# Patient Record
Sex: Male | Born: 1965 | Race: White | Hispanic: No | Marital: Married | Smoking: Never smoker
Health system: Southern US, Community
[De-identification: ages and names within clinical notes are randomized; demographics above are authoritative.]

## PROBLEM LIST (undated history)

## (undated) DIAGNOSIS — N200 Calculus of kidney: Secondary | ICD-10-CM

---

## 2014-07-31 ENCOUNTER — Other Ambulatory Visit (HOSPITAL_COMMUNITY): Payer: Self-pay | Admitting: Sports Medicine

## 2014-07-31 ENCOUNTER — Encounter (HOSPITAL_COMMUNITY): Payer: Self-pay

## 2014-07-31 ENCOUNTER — Ambulatory Visit (HOSPITAL_COMMUNITY)
Admission: RE | Admit: 2014-07-31 | Discharge: 2014-07-31 | Disposition: A | Discharge status: Home / Self Care | Payer: BLUE CROSS/BLUE SHIELD | Source: Ambulatory Visit | Attending: Sports Medicine | Admitting: Sports Medicine

## 2014-07-31 DIAGNOSIS — M545 Low back pain, unspecified: Secondary | ICD-10-CM

## 2014-07-31 DIAGNOSIS — R1031 Right lower quadrant pain: Secondary | ICD-10-CM | POA: Diagnosis not present

## 2014-08-16 ENCOUNTER — Emergency Department (HOSPITAL_COMMUNITY): Payer: BLUE CROSS/BLUE SHIELD

## 2014-08-16 ENCOUNTER — Encounter (HOSPITAL_COMMUNITY): Payer: Self-pay | Admitting: Emergency Medicine

## 2014-08-16 ENCOUNTER — Emergency Department (HOSPITAL_COMMUNITY)
Admission: EM | Admit: 2014-08-16 | Discharge: 2014-08-16 | Disposition: A | Discharge status: Home / Self Care | Payer: BLUE CROSS/BLUE SHIELD | Attending: Emergency Medicine | Admitting: Emergency Medicine

## 2014-08-16 DIAGNOSIS — J069 Acute upper respiratory infection, unspecified: Secondary | ICD-10-CM | POA: Diagnosis not present

## 2014-08-16 DIAGNOSIS — J029 Acute pharyngitis, unspecified: Secondary | ICD-10-CM | POA: Diagnosis present

## 2014-08-16 DIAGNOSIS — Z79899 Other long term (current) drug therapy: Secondary | ICD-10-CM | POA: Diagnosis not present

## 2014-08-16 DIAGNOSIS — B9789 Other viral agents as the cause of diseases classified elsewhere: Secondary | ICD-10-CM

## 2014-08-16 DIAGNOSIS — Z87442 Personal history of urinary calculi: Secondary | ICD-10-CM | POA: Insufficient documentation

## 2014-08-16 HISTORY — DX: Calculus of kidney: N20.0

## 2014-08-16 LAB — RAPID STREP SCREEN (MED CTR MEBANE ONLY): Streptococcus, Group A Screen (Direct): NEGATIVE

## 2014-08-16 MED ORDER — DEXAMETHASONE SODIUM PHOSPHATE 4 MG/ML IJ SOLN
4.0000 mg | Freq: Once | INTRAMUSCULAR | Status: AC
Start: 2014-08-16 — End: 2014-08-16
  Administered 2014-08-16: 4 mg via INTRAMUSCULAR
  Filled 2014-08-16: qty 1

## 2014-08-16 MED ORDER — LIDOCAINE VISCOUS 2 % MT SOLN
15.0000 mL | Freq: Once | OROMUCOSAL | Status: AC
  Administered 2014-08-16: 15 mL via OROMUCOSAL
  Filled 2014-08-16: qty 15

## 2014-08-16 MED ORDER — LIDOCAINE VISCOUS 2 % MT SOLN: 20.0000 mL | OROMUCOSAL | Status: DC | PRN

## 2014-08-16 NOTE — ED Provider Notes (Signed)
CSN: [id number]     Arrival date & time 08/16/14  1009 History   First MD Initiated Contact with Patient 08/16/14 1013     Chief Complaint  Patient presents with  . Sore Throat  . URI   HPI  Patient is a 49 year old male with past medical history of kidney stones who presents emergency room for evaluation of sore throat and cough. Patient states that for the last 4 days he's been having coughing, congestion, mildly runny nose, production of clear sputum. Patient states that he woke up this morning with a very sore throat. He felt like his throat was really swollen and very uncomfortable to swallow. He took ibuprofen and DayQuil. He currently feels that his throat is only sore if he has to swallow. He has been able to tolerate eating and drinking this morning. He currently denies chest pain, shortness of breath, difficulty with tolerating secretions, fevers, nausea, vomiting. Patient does not have a PCP here. He does state that his grandchildren who has been exposed to in the past week has had similar upper respiratory infection like symptoms. Patient states he has no known allergies at this time.  Past Medical History  Diagnosis Date  . Kidney calculi    History reviewed. No pertinent past surgical history. No family history on file. History  Substance Use Topics  . Smoking status: Never Smoker   . Smokeless tobacco: Not on file  . Alcohol Use: Yes    Review of Systems  Constitutional: Negative for fever, chills and fatigue.  HENT: Positive for congestion and sore throat. Negative for ear discharge, ear pain, rhinorrhea and sinus pressure.   Respiratory: Positive for cough. Negative for chest tightness and shortness of breath.   Cardiovascular: Negative for chest pain and palpitations.  Gastrointestinal: Negative for nausea and vomiting.  All other systems reviewed and are negative.     Allergies  Review of patient's allergies indicates no known allergies.  Home Medications    Prior to Admission medications   Medication Sig Start Date End Date Taking? Authorizing Provider  chlorthalidone (HYGROTON) 25 MG tablet Take 25 mg by mouth daily.   Yes Historical Provider, MD  omeprazole (PRILOSEC) 20 MG capsule Take 20 mg by mouth daily.   Yes Historical Provider, MD  Pseudoephedrine-APAP-DM (DAYQUIL MULTI-SYMPTOM COLD/FLU PO) Take 30 mLs by mouth 2 (two) times daily as needed (cough and sore throat).   Yes Historical Provider, MD  lidocaine (XYLOCAINE) 2 % solution Use as directed 20 mLs in the mouth or throat as needed for mouth pain. 08/16/14   Klair Leising A Forcucci, PA-C   BP 131/64 mmHg  Pulse 80  Temp(Src) 98.5 F (36.9 C) (Oral)  Resp 16  SpO2 93% Physical Exam  Constitutional: He is oriented to person, place, and time. He appears well-developed and well-nourished. No distress.  HENT:  Head: Normocephalic and atraumatic.  Right Ear: Tympanic membrane normal.  Left Ear: Tympanic membrane normal.  Nose: Mucosal edema present. Right sinus exhibits no maxillary sinus tenderness and no frontal sinus tenderness. Left sinus exhibits no maxillary sinus tenderness and no frontal sinus tenderness.  Mouth/Throat: Uvula is midline and mucous membranes are normal. No trismus in the jaw. Posterior oropharyngeal edema and posterior oropharyngeal erythema present. No oropharyngeal exudate.  Eyes: Conjunctivae and EOM are normal. Pupils are equal, round, and reactive to light. No scleral icterus.  Neck: Normal range of motion. Neck supple. No JVD present. No thyromegaly present.  Cardiovascular: Normal rate, regular rhythm, normal heart sounds  and intact distal pulses.  Exam reveals no gallop and no friction rub.   No murmur heard. Pulmonary/Chest: Effort normal and breath sounds normal. No respiratory distress. He has no wheezes. He has no rales. He exhibits no tenderness.  Musculoskeletal: Normal range of motion.  Lymphadenopathy:    He has no cervical adenopathy.   Neurological: He is alert and oriented to person, place, and time.  Skin: Skin is warm and dry. He is not diaphoretic.  Psychiatric: He has a normal mood and affect. His behavior is normal. Judgment and thought content normal.  Nursing note and vitals reviewed.   ED Course  Procedures (including critical care time) Labs Review Labs Reviewed  RAPID STREP SCREEN  CULTURE, GROUP A STREP    Imaging Review Dg Chest 2 View  08/16/2014   CLINICAL DATA:  Cough, sore throat, throat swelling for 4 days  EXAM: CHEST  2 VIEW  COMPARISON:  None.  FINDINGS: Cardiomediastinal silhouette is unremarkable. No acute infiltrate or pleural effusion. No pulmonary edema. Bony thorax is unremarkable.  IMPRESSION: No active cardiopulmonary disease.   Electronically Signed   By: Natasha Mead M.D.   On: 08/16/2014 10:53     EKG Interpretation None      MDM   Final diagnoses:  Viral URI with cough   Patient is a 49 year old male who presents emergency room for evaluation of sore throat and upper respiratory infection symptoms. Patient states this been going on for 4 days. Physical exam reveals some edema and erythema of the oropharynx with midline uvular rise. There is no evidence for trismus, airway compromise, or peritonsillar abscess at this time. Patient is able to tolerate by mouth fluids. Rapid strep throat is negative. Chest x-ray is negative. Suspect that this is likely a viral upper respiratory infection with cough. Patient given viscous lidocaine with great relief of symptoms. We'll give an IM shot of 4 mg of Decadron for symptomatic relief. I have recommended that he continue take ibuprofen as needed for sore throat. Patient to return for difficulty swallowing, trismus, drooling, worsening shortness of breath, or intractable fevers. He states understanding and agreement at this time. Patient given resource list to set up care with a primary care doctor in this area. Patient discussed with Dr. Silverio Lay who  agrees the above workup and plan. Patient stable for discharge at this time.  Eben Burow, PA-C 08/16/14 1135  Richardean Canal, MD 08/16/14 [phone]

## 2014-08-16 NOTE — ED Notes (Signed)
Pt verbalized understanding of discharge instructions and has no further questions.  

## 2014-08-16 NOTE — Discharge Instructions (Signed)
Upper Respiratory Infection, Adult °An upper respiratory infection (URI) is also sometimes known as the common cold. The upper respiratory tract includes the nose, sinuses, throat, trachea, and bronchi. Bronchi are the airways leading to the lungs. Most people improve within 1 week, but symptoms can last up to 2 weeks. A residual cough may last even longer.  °CAUSES °Many different viruses can infect the tissues lining the upper respiratory tract. The tissues become irritated and inflamed and often become very moist. Mucus production is also common. A cold is contagious. You can easily spread the virus to others by oral contact. This includes kissing, sharing a glass, coughing, or sneezing. Touching your mouth or nose and then touching a surface, which is then touched by another person, can also spread the virus. °SYMPTOMS  °Symptoms typically develop 1 to 3 days after you come in contact with a cold virus. Symptoms vary from person to person. They may include: °· Runny nose. °· Sneezing. °· Nasal congestion. °· Sinus irritation. °· Sore throat. °· Loss of voice (laryngitis). °· Cough. °· Fatigue. °· Muscle aches. °· Loss of appetite. °· Headache. °· Low-grade fever. °DIAGNOSIS  °You might diagnose your own cold based on familiar symptoms, since most people get a cold 2 to 3 times a year. Your caregiver can confirm this based on your exam. Most importantly, your caregiver can check that your symptoms are not due to another disease such as strep throat, sinusitis, pneumonia, asthma, or epiglottitis. Blood tests, throat tests, and X-rays are not necessary to diagnose a common cold, but they may sometimes be helpful in excluding other more serious diseases. Your caregiver will decide if any further tests are required. °RISKS AND COMPLICATIONS  °You may be at risk for a more severe case of the common cold if you smoke cigarettes, have chronic heart disease (such as heart failure) or lung disease (such as asthma), or if  you have a weakened immune system. The very young and very old are also at risk for more serious infections. Bacterial sinusitis, middle ear infections, and bacterial pneumonia can complicate the common cold. The common cold can worsen asthma and chronic obstructive pulmonary disease (COPD). Sometimes, these complications can require emergency medical care and may be life-threatening. °PREVENTION  °The best way to protect against getting a cold is to practice good hygiene. Avoid oral or hand contact with people with cold symptoms. Wash your hands often if contact occurs. There is no clear evidence that vitamin C, vitamin E, echinacea, or exercise reduces the chance of developing a cold. However, it is always recommended to get plenty of rest and practice good nutrition. °TREATMENT  °Treatment is directed at relieving symptoms. There is no cure. Antibiotics are not effective, because the infection is caused by a virus, not by bacteria. Treatment may include: °· Increased fluid intake. Sports drinks offer valuable electrolytes, sugars, and fluids. °· Breathing heated mist or steam (vaporizer or shower). °· Eating chicken soup or other clear broths, and maintaining good nutrition. °· Getting plenty of rest. °· Using gargles or lozenges for comfort. °· Controlling fevers with ibuprofen or acetaminophen as directed by your caregiver. °· Increasing usage of your inhaler if you have asthma. °Zinc gel and zinc lozenges, taken in the first 24 hours of the common cold, can shorten the duration and lessen the severity of symptoms. Pain medicines may help with fever, muscle aches, and throat pain. A variety of non-prescription medicines are available to treat congestion and runny nose. Your caregiver   can make recommendations and may suggest nasal or lung inhalers for other symptoms.  HOME CARE INSTRUCTIONS   Only take over-the-counter or prescription medicines for pain, discomfort, or fever as directed by your  caregiver.  Use a warm mist humidifier or inhale steam from a shower to increase air moisture. This may keep secretions moist and make it easier to breathe.  Drink enough water and fluids to keep your urine clear or pale yellow.  Rest as needed.  Return to work when your temperature has returned to normal or as your caregiver advises. You may need to stay home longer to avoid infecting others. You can also use a face mask and careful hand washing to prevent spread of the virus. SEEK MEDICAL CARE IF:   After the first few days, you feel you are getting worse rather than better.  You need your caregiver's advice about medicines to control symptoms.  You develop chills, worsening shortness of breath, or brown or red sputum. These may be signs of pneumonia.  You develop yellow or brown nasal discharge or pain in the face, especially when you bend forward. These may be signs of sinusitis.  You develop a fever, swollen neck glands, pain with swallowing, or white areas in the back of your throat. These may be signs of strep throat. SEEK IMMEDIATE MEDICAL CARE IF:   You have a fever.  You develop severe or persistent headache, ear pain, sinus pain, or chest pain.  You develop wheezing, a prolonged cough, cough up blood, or have a change in your usual mucus (if you have chronic lung disease).  You develop sore muscles or a stiff neck. Document Released: 01/04/2001 Document Revised: 10/03/2011 Document Reviewed: 10/16/2013 [facility] Patient Information 2015 [place]. This information is not intended to replace advice given to you by your health care provider. Make sure you discuss any questions you have with your health care provider.  Pharyngitis Pharyngitis is redness, pain, and swelling (inflammation) of your pharynx.  CAUSES  Pharyngitis is usually caused by infection. Most of the time, these infections are from viruses (viral) and are part of a cold. However, sometimes pharyngitis  is caused by bacteria (bacterial). Pharyngitis can also be caused by allergies. Viral pharyngitis may be spread from person to person by coughing, sneezing, and personal items or utensils (cups, forks, spoons, toothbrushes). Bacterial pharyngitis may be spread from person to person by more intimate contact, such as kissing.  SIGNS AND SYMPTOMS  Symptoms of pharyngitis include:   Sore throat.   Tiredness (fatigue).   Low-grade fever.   Headache.  Joint pain and muscle aches.  Skin rashes.  Swollen lymph nodes.  Plaque-like film on throat or tonsils (often seen with bacterial pharyngitis). DIAGNOSIS  Your health care provider will ask you questions about your illness and your symptoms. Your medical history, along with a physical exam, is often all that is needed to diagnose pharyngitis. Sometimes, a rapid strep test is done. Other lab tests may also be done, depending on the suspected cause.  TREATMENT  Viral pharyngitis will usually get better in 3-4 days without the use of medicine. Bacterial pharyngitis is treated with medicines that kill germs (antibiotics).  HOME CARE INSTRUCTIONS   Drink enough water and fluids to keep your urine clear or pale yellow.   Only take over-the-counter or prescription medicines as directed by your health care provider:   If you are prescribed antibiotics, make sure you finish them even if you start to feel better.  Do not take aspirin.   Get lots of rest.   Gargle with 8 oz of salt water ( tsp of salt per 1 qt of water) as often as every 1-2 hours to soothe your throat.   Throat lozenges (if you are not at risk for choking) or sprays may be used to soothe your throat. SEEK MEDICAL CARE IF:   You have large, tender lumps in your neck.  You have a rash.  You cough up green, yellow-brown, or bloody spit. SEEK IMMEDIATE MEDICAL CARE IF:   Your neck becomes stiff.  You drool or are unable to swallow liquids.  You vomit or are  unable to keep medicines or liquids down.  You have severe pain that does not go away with the use of recommended medicines.  You have trouble breathing (not caused by a stuffy nose). MAKE SURE YOU:   Understand these instructions.  Will watch your condition.  Will get help right away if you are not doing well or get worse. Document Released: 07/11/2005 Document Revised: 05/01/2013 Document Reviewed: 03/18/2013 [facility] Patient Information 2015 [place], [place]. This information is not intended to replace advice given to you by your health care provider. Make sure you discuss any questions you have with your health care provider.   Emergency Department Resource Guide 1) Find a Doctor and Pay Out of Pocket Although you won't have to find out who is covered by your insurance plan, it is a good idea to ask around and get recommendations. You will then need to call the office and see if the doctor you have chosen will accept you as a new patient and what types of options they offer for patients who are self-pay. Some doctors offer discounts or will set up payment plans for their patients who do not have insurance, but you will need to ask so you aren't surprised when you get to your appointment.  2) Contact Your Local Health Department Not all health departments have doctors that can see patients for sick visits, but many do, so it is worth a call to see if yours does. If you don't know where your local health department is, you can check in your phone book. The CDC also has a tool to help you locate your state's health department, and many state websites also have listings of all of their local health departments.  3) Find a Walk-in Clinic If your illness is not likely to be very severe or complicated, you may want to try a walk in clinic. These are popping up all over the country in pharmacies, drugstores, and shopping centers. They're usually staffed by nurse practitioners or physician  assistants that have been trained to treat common illnesses and complaints. They're usually fairly quick and inexpensive. However, if you have serious medical issues or chronic medical problems, these are probably not your best option.  No Primary Care Doctor: - Call Health Connect at  [phone] - they can help you locate a primary care doctor that  accepts your insurance, provides certain services, etc. - Physician Referral Service- [phone]  Chronic Pain Problems: Organization         Address  Phone   Notes  [facility]  [phone] Patients need to be referred by their primary care doctor.   Medication Assistance: Organization         Address  Phone   Notes  [facility] Medication [facility] [address] [place], [place] [address] [phone] --Must be a resident of [facility] [place] -- Must have NO insurance coverage whatsoever (no Medicaid/ Medicare, etc.) -- The pt. MUST have a primary care doctor that directs their care regularly and follows them in the community   [facility]  [phone]   [employer]  [phone]    Agencies that provide inexpensive medical care: Organization         Address  Phone   Notes  [facility]  [phone]   [facility]    [phone]   [facility] [address] [place], [place] [address] [phone]   [facility] [address], [place] [phone]   Planned Parenthood    [phone]   [facility]    [phone]   [facility]  [address], [place] Phone:  [phone], Fax:  [phone] Hours of Operation:  9 am - 6 pm, M-F.  Also accepts Medicaid/Medicare and self-pay.  [facility]  [address], [place] Phone: [phone], Fax: [phone]. Hours of Operation:  8:30 am - 5:30 pm, M-F.  Also accepts  Medicaid and self-pay.  [facility] [place] [address] [place] Phone: [phone]   [facility] [address] Natasha Bence [place], [place] [phone], Ext. [phone] Mondays & Thursdays: 7-9 AM.  First 15 patients are seen on a first come, first serve basis.    Medicaid-accepting [facility] Providers:  Organization         Address  Phone   Notes  [facility] [address], [place] [phone] Also accepts self-pay patients.  [facility] [address] Laurell Josephs [place], [place]  [phone]   [facility] [address], [place] [phone]   [facility] Family Medicine [address], [place] [phone]   Renaye Rakers [address], [place]   [phone] Only accepts [place] Access [place] patients after they have their name applied to their card.   Self-Pay (no insurance) in [facility]:  Organization         Address  Phone   Notes  Sickle Cell Patients, [facility] Internal Medicine [address] [place], [place] [phone]   [facility] [address] [place], [place] [phone]   [facility]  [address], [place] [phone]   [facility]/Dr. Osei-Bonsu  [address], [place] or [address], [place] [phone] Phone number for both [place] and [place] locations is the same.  [facility] [address], [place] [phone]   [facility] [address], [place] or [address] [phone]   [facility] [address] [place] [phone], phone; [phone], fax Sees patients 1st and 3rd Saturday of every month.  Must not qualify for public or private insurance (i.e. Medicaid, Medicare, [place] Health Choice, Veterans' Benefits)  Household  income should be no more than 200% of the poverty level The clinic cannot treat you if you are pregnant or think you are pregnant  Sexually transmitted diseases are not treated at the clinic.    Dental Care: Organization  Address  Phone  Notes  [facility] Department of [facility] [address] [place], [place] [phone] Accepts children up to age 70 who are enrolled in [place] or [place] Health Choice; pregnant women with a Medicaid card; and children who have applied for Medicaid or [place] Health Choice, but were declined, whose parents can pay a reduced fee at time of service.  [facility] Department of [facility]  [address], [place] [phone] Accepts children up to age 103 who are enrolled in [place] or [place] Health Choice; pregnant women with a Medicaid card; and children who have applied for Medicaid or [place] Health Choice, but were declined, whose parents can pay a reduced fee at time of service.  [facility]  [address] [place], [place] [phone] Patients are seen by appointment only. Walk-ins are not accepted. [facility] will see patients 28 years of age and older. Monday - Tuesday (8am-5pm) Most Wednesdays (8:30-5pm) $30 per visit, cash only  [facility]  [address], [facility] [phone] Patients are seen by appointment only. Walk-ins are not accepted. [facility] will see patients 48 years of age and older. One Wednesday Evening (Monthly: Volunteer Based).  $30 per visit, cash only  [employer]  [phone] for adults; Children under age 19, call Graduate Pediatric Dentistry at [phone]. Children aged 20-14, please call [phone] to request a pediatric application.  Dental services are provided in all areas of dental care including fillings, crowns and bridges, complete and partial dentures, implants, gum  treatment, root canals, and extractions. Preventive care is also provided. Treatment is provided to both adults and children. Patients are selected via a lottery and there is often a waiting list.   [facility] [address], [place]  [phone] [email or web address]   [facility] [address] [place], [place] [phone], Ext. [phone] Second and Fourth Thursday of each month, opens at 6:30 AM; Clinic ends at 9 AM.  Patients are seen on a first-come first-served basis, and a limited number are seen during each clinic.   [facility]  [address] Ether Griffins [place], [place] [phone]   Eligibility Requirements You must have lived in [place], [place], or [place] counties for at least the last three months.   You cannot be eligible for state or federal sponsored [place], including [employer], [place], or [employer].   You generally cannot be eligible for healthcare insurance through your employer.    How to apply: Eligibility screenings are held every Tuesday and Wednesday afternoon from 1:00 pm until 4:00 pm. You do not need an appointment for the interview!  [facility] [address] [place], [place] [phone]   [facility] Health Department  [phone]   [facility] Health Department  [phone]   [facility] Health Department  [phone]    Behavioral Health Resources in the Community: Intensive Outpatient Programs Organization         Address  Phone  Notes  [facility] [address] [place], [place] [phone]   [facility] Outpatient [address], [place], [place] [phone]   [facility] [address] [place], [place]  [phone]   [facility] Mental Health [address]  [place], [place] [phone] or [phone]   Substance Abuse Resources Organization  Address  Phone  Notes  Alcohol and  Drug Services  [phone]   [facility]  [phone]   [facility]  [phone]   Floydene Flock  [phone]   Residential & Outpatient Substance Abuse Program  [phone]   Psychological Services Organization         Address  Phone  Notes  [facility]  [phone]   [facility]  [phone]   [facility] Mental Health [address], [place] [phone] or [phone]    Mobile Crisis Teams Organization         Address  Phone  Notes  [facility], Mobile Crisis Care Unit  [phone]   [facility]  [address] [place], [place] [phone]   Doristine Locks [address] [place] [phone]    Self-Help/Support Groups Organization         Address  Phone             Notes  [facility] [place] - variety of support groups  [phone]- [phone] Call for more information  Narcotics Anonymous (NA), [facility] [address], [employer] [place]  2 meetings at this location   Statistician         Address  Phone  Notes  [facility] [address],    [place]  [phone]   [facility]  [address], [place] [address], [place], [place] [phone]   [facility] Treatment Facility [address] [place], [place] [phone] Admissions: 8am-3pm M-F  [facility] [address]    [place], [place] [phone]   [facility] [address] [place], [place], [place] [phone]   [facility] [address]  [place], [place] [phone]   [facility] - Intensive Outpatient [address] Laurell Josephs [address], [place], [place] [phone]   [facility] ([facility] [address] [place]  [place], [place] [phone] or [phone]   [facility] ([facility]) [address], [place], [place] [phone] Accepts Medicaid  [facility]  [place] [address]  [place] [phone] Substance Abuse/Addiction Treatment   [facility] Organization         Address  Phone  Notes  [facility]  [phone]   Angie Fava, PhD [address] Ervin Knack [place], [place]   [phone] or [phone]   [facility] Behavioral   [address] [place], [place] [phone]   [facility] [address], [place], [place] [phone] Insurance/Medicaid/sponsorship through [facility] and Families [address]                                    [place], [place] [phone] Therapy/tele-psych/case  [facility] [address], [place] [phone]    Dr. Lolly Mustache  [phone]   [facility]  [employer] [facility] Dept. 1) [address] [place] 2) [address] [place] 3)  [address], [place] [phone]  [phone]   [facility] Child Abuse Hotline 984-469-7212 or [phone] (After Hours)

## 2014-08-16 NOTE — ED Notes (Signed)
Pt. Stated, I've had a cold and I feel like my throat has glass in there when I swallow

## 2014-08-18 LAB — CULTURE, GROUP A STREP

## 2019-02-10 ENCOUNTER — Emergency Department (HOSPITAL_COMMUNITY): Payer: 59

## 2019-02-10 ENCOUNTER — Encounter (HOSPITAL_COMMUNITY): Payer: Self-pay | Admitting: Emergency Medicine

## 2019-02-10 ENCOUNTER — Other Ambulatory Visit: Payer: Self-pay

## 2019-02-10 ENCOUNTER — Emergency Department (HOSPITAL_COMMUNITY)
Admission: EM | Admit: 2019-02-10 | Discharge: 2019-02-10 | Disposition: A | Discharge status: Home / Self Care | Payer: 59 | Attending: Emergency Medicine | Admitting: Emergency Medicine

## 2019-02-10 DIAGNOSIS — Z6835 Body mass index (BMI) 35.0-35.9, adult: Secondary | ICD-10-CM | POA: Diagnosis not present

## 2019-02-10 DIAGNOSIS — N202 Calculus of kidney with calculus of ureter: Secondary | ICD-10-CM | POA: Insufficient documentation

## 2019-02-10 DIAGNOSIS — Z79899 Other long term (current) drug therapy: Secondary | ICD-10-CM | POA: Insufficient documentation

## 2019-02-10 DIAGNOSIS — N23 Unspecified renal colic: Secondary | ICD-10-CM

## 2019-02-10 DIAGNOSIS — Z87442 Personal history of urinary calculi: Secondary | ICD-10-CM | POA: Insufficient documentation

## 2019-02-10 DIAGNOSIS — E669 Obesity, unspecified: Secondary | ICD-10-CM | POA: Insufficient documentation

## 2019-02-10 DIAGNOSIS — N130 Hydronephrosis with ureteropelvic junction obstruction: Secondary | ICD-10-CM | POA: Insufficient documentation

## 2019-02-10 DIAGNOSIS — R1032 Left lower quadrant pain: Secondary | ICD-10-CM | POA: Diagnosis present

## 2019-02-10 DIAGNOSIS — N132 Hydronephrosis with renal and ureteral calculous obstruction: Secondary | ICD-10-CM

## 2019-02-10 HISTORY — DX: Calculus of kidney: N20.0

## 2019-02-10 LAB — CBC
HCT: 48.8 % (ref 39.0–52.0)
Hemoglobin: 16.6 g/dL (ref 13.0–17.0)
MCH: 31 pg (ref 26.0–34.0)
MCHC: 34 g/dL (ref 30.0–36.0)
MCV: 91 fL (ref 80.0–100.0)
Platelets: 235 10*3/uL (ref 150–400)
RBC: 5.36 MIL/uL (ref 4.22–5.81)
RDW: 12.7 % (ref 11.5–15.5)
WBC: 8.8 10*3/uL (ref 4.0–10.5)
nRBC: 0 % (ref 0.0–0.2)

## 2019-02-10 LAB — URINALYSIS, ROUTINE W REFLEX MICROSCOPIC
Glucose, UA: NEGATIVE mg/dL
Ketones, ur: NEGATIVE mg/dL
Nitrite: NEGATIVE
Protein, ur: 100 mg/dL — AB
Specific Gravity, Urine: 1.025 (ref 1.005–1.030)
pH: 6 (ref 5.0–8.0)

## 2019-02-10 LAB — BASIC METABOLIC PANEL
Anion gap: 10 (ref 5–15)
BUN: 15 mg/dL (ref 6–20)
CO2: 25 mmol/L (ref 22–32)
Calcium: 9.1 mg/dL (ref 8.9–10.3)
Chloride: 104 mmol/L (ref 98–111)
Creatinine, Ser: 1.07 mg/dL (ref 0.61–1.24)
GFR calc Af Amer: >60 mL/min (ref >60)
GFR calc non Af Amer: >60 mL/min (ref >60)
Glucose, Bld: 76 mg/dL (ref 70–99)
Potassium: 4.3 mmol/L (ref 3.5–5.1)
Sodium: 139 mmol/L (ref 135–145)

## 2019-02-10 LAB — URINALYSIS, MICROSCOPIC (REFLEX): RBC / HPF: >50 RBC/hpf (ref 0–5)

## 2019-02-10 MED ORDER — HYDROMORPHONE HCL 1 MG/ML IJ SOLN
1.0000 mg | Freq: Once | INTRAMUSCULAR | Status: AC
  Administered 2019-02-10: 19:00:00 1 mg via INTRAVENOUS
  Filled 2019-02-10: qty 1

## 2019-02-10 MED ORDER — HYDROMORPHONE HCL 1 MG/ML IJ SOLN
1.0000 mg | Freq: Once | INTRAMUSCULAR | Status: AC
  Administered 2019-02-10: 18:00:00 1 mg via INTRAVENOUS
  Filled 2019-02-10: qty 1

## 2019-02-10 MED ORDER — HYDROMORPHONE HCL 1 MG/ML IJ SOLN
1.0000 mg | Freq: Once | INTRAMUSCULAR | Status: AC
  Administered 2019-02-10: 1 mg via INTRAVENOUS
  Filled 2019-02-10: qty 1

## 2019-02-10 MED ORDER — OXYCODONE-ACETAMINOPHEN 5-325 MG PO TABS: 1.0000 | ORAL_TABLET | ORAL | 0 refills | Status: DC | PRN

## 2019-02-10 MED ORDER — ONDANSETRON HCL 4 MG/2ML IJ SOLN
4.0000 mg | Freq: Once | INTRAMUSCULAR | Status: AC
  Administered 2019-02-10: 16:00:00 4 mg via INTRAVENOUS
  Filled 2019-02-10: qty 2

## 2019-02-10 MED ORDER — IBUPROFEN 600 MG PO TABS: 600.0000 mg | ORAL_TABLET | Freq: Three times a day (TID) | ORAL | 0 refills | Status: DC | PRN

## 2019-02-10 MED ORDER — TAMSULOSIN HCL 0.4 MG PO CAPS: 0.4000 mg | ORAL_CAPSULE | Freq: Every day | ORAL | 0 refills | Status: DC

## 2019-02-10 MED ORDER — KETOROLAC TROMETHAMINE 30 MG/ML IJ SOLN
15.0000 mg | Freq: Once | INTRAMUSCULAR | Status: AC
  Administered 2019-02-10: 17:00:00 15 mg via INTRAVENOUS
  Filled 2019-02-10: qty 1

## 2019-02-10 MED ORDER — SODIUM CHLORIDE 0.9 % IV SOLN
1.5000 mg/kg | Freq: Once | INTRAVENOUS | Status: AC
  Administered 2019-02-10: 20:00:00 176 mg via INTRAVENOUS
  Filled 2019-02-10: qty 8.8

## 2019-02-10 MED ORDER — ONDANSETRON 8 MG PO TBDP: 8.0000 mg | ORAL_TABLET | Freq: Three times a day (TID) | ORAL | 0 refills | Status: DC | PRN

## 2019-02-10 MED ORDER — OXYCODONE-ACETAMINOPHEN 5-325 MG PO TABS
2.0000 | ORAL_TABLET | Freq: Once | ORAL | Status: AC
  Administered 2019-02-10: 22:00:00 2 via ORAL
  Filled 2019-02-10: qty 2

## 2019-02-10 NOTE — ED Provider Notes (Signed)
[facility] EMERGENCY DEPARTMENT Provider Note   CSN: [id number] Arrival date & time: 02/10/19  1357    History   Chief Complaint Chief Complaint  Patient presents with   Flank Pain    HPI Bradley Frazier is a 53 y.o. male.     HPI   He presents for evaluation of right lower quadrant abdominal pain that started 3 hours ago, and feels like a kidney stone problem to him.  Last difficulty with kidney stones was about a year ago.  He denies dysuria, urinary frequency, hematuria, fever, chills, nausea or vomiting.  There are no other known modifying factors.  Past Medical History:  Diagnosis Date   Kidney calculi    Kidney stone     There are no active problems to display for this patient.   History reviewed. No pertinent surgical history.      Home Medications    Prior to Admission medications   Medication Sig Start Date End Date Taking? Authorizing Provider  chlorthalidone (HYGROTON) 25 MG tablet Take 25 mg by mouth daily.    [provider]  lidocaine (XYLOCAINE) 2 % solution Use as directed 20 mLs in the mouth or throat as needed for mouth pain. 08/16/14   Rolene Course, PA-C  omeprazole (PRILOSEC) 20 MG capsule Take 20 mg by mouth daily.    [provider]  Pseudoephedrine-APAP-DM (DAYQUIL MULTI-SYMPTOM COLD/FLU PO) Take 30 mLs by mouth 2 (two) times daily as needed (cough and sore throat).    [provider]    Family History No family history on file.  Social History Social History   Tobacco Use   Smoking status: Never Smoker   Smokeless tobacco: Never Used  Substance Use Topics   Alcohol use: Yes   Drug use: No     Allergies   Patient has no known allergies.   Review of Systems Review of Systems  All other systems reviewed and are negative.    Physical Exam Updated Vital Signs BP 126/79    Pulse 63    Temp 97.9 F (36.6 C) (Oral)    Resp 11    Ht 6' (1.829 m)    Wt 117.9 kg    SpO2 93%    BMI  35.26 kg/m   Physical Exam Vitals signs and nursing note reviewed.  Constitutional:      General: He is in acute distress (Uncomfortable).     Appearance: He is well-developed. He is obese. He is not ill-appearing, toxic-appearing or diaphoretic.  HENT:     Head: Normocephalic and atraumatic.     Right Ear: External ear normal.     Left Ear: External ear normal.     Mouth/Throat:     Mouth: Mucous membranes are moist.     Pharynx: No oropharyngeal exudate or posterior oropharyngeal erythema.  Eyes:     Conjunctiva/sclera: Conjunctivae normal.     Pupils: Pupils are equal, round, and reactive to light.  Neck:     Musculoskeletal: Normal range of motion and neck supple.     Trachea: Phonation normal.  Cardiovascular:     Rate and Rhythm: Normal rate.  Pulmonary:     Effort: Pulmonary effort is normal.  Abdominal:     General: There is no distension.     Palpations: Abdomen is soft.     Tenderness: There is no abdominal tenderness.     Hernia: No hernia is present.  Genitourinary:    Comments: No right flank tenderness. Musculoskeletal:  Normal range of motion.  Skin:    General: Skin is warm and dry.  Neurological:     Mental Status: He is alert and oriented to person, place, and time.     Cranial Nerves: No cranial nerve deficit.     Sensory: No sensory deficit.     Motor: No abnormal muscle tone.     Coordination: Coordination normal.  Psychiatric:        Mood and Affect: Mood normal.        Behavior: Behavior normal.        Thought Content: Thought content normal.        Judgment: Judgment normal.      ED Treatments / Results  Labs (all labs ordered are listed, but only abnormal results are displayed) Labs Reviewed  URINALYSIS, ROUTINE W REFLEX MICROSCOPIC - Abnormal; Notable for the following components:      Result Value   APPearance CLOUDY (*)    Hgb urine dipstick LARGE (*)    Bilirubin Urine SMALL (*)    Protein, ur 100 (*)    Leukocytes,Ua TRACE (*)      All other components within normal limits  URINALYSIS, MICROSCOPIC (REFLEX) - Abnormal; Notable for the following components:   Bacteria, UA RARE (*)    All other components within normal limits    EKG None  Radiology Ct Renal Stone Study  Result Date: 02/10/2019 CLINICAL DATA:  Left flank pain today. History of multiple renal stones. EXAM: CT ABDOMEN AND PELVIS WITHOUT CONTRAST TECHNIQUE: Multidetector CT imaging of the abdomen and pelvis was performed following the standard protocol without IV contrast. COMPARISON:  06/30/2017 FINDINGS: Lower chest: Lung bases are normal. Hepatobiliary: Liver, gallbladder and biliary tree are normal. Pancreas: Normal. Spleen: Normal. Adrenals/Urinary Tract: Adrenal glands are normal. Kidneys are normal in size with several small right-sided renal stones present. A few small bilateral renal cortical hypodensities unchanged and likely cysts. No left renal stones. No left-sided hydronephrosis. There is mild right-sided hydronephrosis and mild dilatation of the proximal right ureter as there is a punctate stone over the proximal right ureter. The appearance of this tiny stone is not significantly changed from the prior exam. Left ureter and bladder are normal. Stomach/Bowel: Stomach and small bowel are normal. Appendix is normal. Minimal colonic diverticulosis. Vascular/Lymphatic: Normal. Reproductive: Prostate gland is normal. Evidence of previous vasectomy. Other: No free fluid or focal inflammatory change. Musculoskeletal: Mild degenerative change of the spine and hips. IMPRESSION: Right-sided nephrolithiasis with mild right-sided hydronephrosis. Punctate stone over the proximal right ureter likely causing this mild obstruction. Findings are similar to 2018. No left renal or ureteral stones visualized. Bilateral renal cysts unchanged. Mild colonic diverticulosis. Electronically Signed   By: Elberta Fortisaniel  Boyle M.D.   On: 02/10/2019 16:20    Procedures Procedures  (including critical care time)  Medications Ordered in ED Medications  lidocaine (XYLOCAINE) 1.5 mg/kg in sodium chloride 0.9 % 100 mL IVPB (has no administration in time range)  HYDROmorphone (DILAUDID) injection 1 mg (1 mg Intravenous Given 02/10/19 1541)  ondansetron (ZOFRAN) injection 4 mg (4 mg Intravenous Given 02/10/19 1541)  HYDROmorphone (DILAUDID) injection 1 mg (1 mg Intravenous Given 02/10/19 1654)  ketorolac (TORADOL) 30 MG/ML injection 15 mg (15 mg Intravenous Given 02/10/19 1655)  HYDROmorphone (DILAUDID) injection 1 mg (1 mg Intravenous Given 02/10/19 1820)     Initial Impression / Assessment and Plan / ED Course  I have reviewed the triage vital signs and the nursing notes.  Pertinent labs &  imaging results that were available during my care of the patient were reviewed by me and considered in my medical decision making (see chart for details).  Clinical Course as of Feb 10 1903  Wynelle LinkSun Feb 10, 2019  1816 He has had transient relief but now the pain is back and worse.  Third dose of hydromorphone ordered.  He has received Toradol.  He is not receiving IV fluids.   [EW]  1817 Abnormal, presence of hemoglobin, bilirubin, protein, leukocytes, red cells.  Urinalysis, Routine w reflex microscopic(!) [EW]  1902 Punctate proximal ureter stone right with mild hydronephrosis.  Images reviewed by me  CT Renal Soundra PilonStone Study [EW]  [id number] Case discussed with urology who recommended lidocaine drip for control of pain, and they will evaluate him for possible additional treatment.   [EW]    Clinical Course User Index [EW] Mancel BaleWentz, Felicha Frayne, MD        Patient Vitals for the past 24 hrs:  BP Temp Temp src Pulse Resp SpO2 Height Weight  02/10/19 1900 -- -- -- -- -- -- 6' (1.829 m) 117.9 kg  02/10/19 1730 126/79 -- -- 63 11 93 % -- --  02/10/19 1715 124/76 -- -- 64 12 94 % -- --  02/10/19 1700 128/77 -- -- 65 15 93 % -- --  02/10/19 1645 110/72 -- -- -- 16 -- -- --  02/10/19 1630 121/78 --  -- -- (!) 21 -- -- --  02/10/19 1615 126/82 -- -- -- 14 -- -- --  02/10/19 1608 124/77 -- -- 65 17 94 % -- --  02/10/19 1401 (!) 158/100 97.9 F (36.6 C) Oral 77 18 99 % -- --    7:02 PM Reevaluation with update and discussion. After initial assessment and treatment, an updated evaluation reveals he is still restless and uncomfortable after 3 doses of Dilaudid and 1 dose of Toradol.  Lidocaine drip pending, urology consult initiated. Mancel BaleElliott Axtyn Woehler   Medical Decision Making: Recurrent kidney stone, right-sided now with hydronephrosis.  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  Doubt UTI or impending vascular collapse.  CRITICAL CARE-no Performed by: Mancel BaleElliott Rhyan Wolters  Nursing Notes Reviewed/ Care Coordinated Applicable Imaging Reviewed Interpretation of Laboratory Data incorporated into ED treatment  Disposition-as per urology in conjunction with oncoming provider team  Final Clinical Impressions(s) / ED Diagnoses   Final diagnoses:  Ureteral stone with hydronephrosis  Renal colic    ED Discharge Orders    None       Mancel BaleWentz, Yelina Sarratt, MD 02/10/19 1904

## 2019-02-10 NOTE — ED Notes (Signed)
Discharge instructions discussed with pt. Pt verbalized understanding. Pt to go home with wife 

## 2019-02-10 NOTE — ED Provider Notes (Signed)
1900: Pt under initial care of Dr Eulis Foster. See previous note for full H&P.   Pt here with flank pain, found to have punctuate stone. Urology has been consulted and will see patient in ER for further management. Anticipate discharge.   Has received dilaudid x 3, toradol and now lidocaine gtt ordered for persistent pain.  Physical Exam  BP 126/79   Pulse 63   Temp 97.9 F (36.6 C) (Oral)   Resp 11   SpO2 93%   Physical Exam Constitutional:      Appearance: He is well-developed.  HENT:     Head: Normocephalic.     Nose: Nose normal.  Eyes:     General: Lids are normal.  Neck:     Musculoskeletal: Normal range of motion.  Cardiovascular:     Rate and Rhythm: Normal rate.  Pulmonary:     Effort: Pulmonary effort is normal. No respiratory distress.  Musculoskeletal: Normal range of motion.  Neurological:     Mental Status: He is alert.  Psychiatric:        Behavior: Behavior normal.     ED Course/Procedures   Clinical Course as of Feb 10 2048  Sun Feb 10, 2019  1816 He has had transient relief but now the pain is back and worse.  Third dose of hydromorphone ordered.  He has received Toradol.  He is not receiving IV fluids.   [EW]  1817 Abnormal, presence of hemoglobin, bilirubin, protein, leukocytes, red cells.  Urinalysis, Routine w reflex microscopic(!) [EW]  1902 Punctate proximal ureter stone right with mild hydronephrosis.  Images reviewed by me  CT Renal Laren Everts [EW]  1287 Case discussed with urology who recommended lidocaine drip for control of pain, and they will evaluate him for possible additional treatment.   [EW]    Clinical Course User Index [EW] Daleen Bo, MD    Procedures  MDM    2050: Urology has evaluated pt in ER. Pain has significantly improved after lidocaine gtt. No labs ordered, these are now pending. Urology recommends dc with 10 percocet, flomax, urology f/u in 1 week if WBC and creatinine unremarkable. Pt handed off to Dr Venora Maples who  will f/u on labs and likely discharge unless clinical decline/return of pain, AKI.       Arlean Hopping 02/10/19 2051    Jola Schmidt, MD 02/10/19 2134

## 2019-02-10 NOTE — ED Notes (Signed)
Pt c/o lower abdominal pain (10/10) and asks for some pain meds.

## 2019-02-10 NOTE — ED Triage Notes (Signed)
Flank paion left x 1 hours  Pt is writhing in chair  Has hx of multiple kidney stones

## 2019-02-10 NOTE — Consult Note (Signed)
Urology Consult Note   Requesting Attending Physician:  Daleen Bo, MD Service Providing Consult: Urology  Consulting Attending: McKenzie  Reason for Consult:  Ureterolithiasis, flank pain   HPI: Bradley Frazier is seen in consultation for reasons noted above at the request of Daleen Bo, MD for evaluation of ureterolithiasis.  This is a 53 y.o. male with history of kidney stones who presented to the [facility] emergency room on 02/10/2019 with acute onset of right lower quadrant pain.  CT scan shows <17mm stone in his mid right ureter with very mild right-sided hydronephrosis.  No left-sided obstructing stone.  Interestingly the stone can be seen on a 2018 CT scan and has not significantly changed in size or location.    In th ED he had severe pain that was refractory to 3 doses of Dilaudid, 1 dose of Toradol.  Symptoms improved with lidocaine infusion.  Afebrile here and at home. Clinically does not seem infected. Urinalysis not overly concerning for infection.  Trace leukocyte esterase, negative nitrite, 0 WBC.  50 RBC.  CBC and BMP within normal limits  Has had approximately 15 kidney stone episodes in his life.  Previous treatment was in [facility].  Last underwent intervention around 2014 with ESWL. Never had USE or PCNL.  At one point ]was on medication to prevent kidney stones but has not taken this in several years.  Previously undergone work-up with 24-hour urine studies but does not remember any of these results.  Thinks he makes calcium stones.   Does not have a urologist since moving to the [place] area.   Upon examination he was well-appearing and reasonably comfortable.  His goal is to go home to try and pass the stone rather than come in for an intervention with stent or ureteroscopic stone extraction.   Past Medical History: Past Medical History:  Diagnosis Date  . Kidney calculi   . Kidney stone     Past Surgical History:  History reviewed. No pertinent  surgical history.  Medication: No current facility-administered medications for this encounter.    Current Outpatient Medications  Medication Sig Dispense Refill  . ibuprofen (ADVIL) 200 MG tablet Take 400 mg by mouth daily.    Marland Kitchen omeprazole (PRILOSEC OTC) 20 MG tablet Take 20 mg by mouth daily.      Allergies: No Known Allergies  Social History: Social History   Tobacco Use  . Smoking status: Never Smoker  . Smokeless tobacco: Never Used  Substance Use Topics  . Alcohol use: Yes  . Drug use: No    Family History No family history on file.  Review of Systems 10 systems were reviewed and are negative except as noted specifically in the HPI.  Objective   Vital signs in last 24 hours: BP 132/67   Pulse (!) 57   Temp 97.9 F (36.6 C) (Oral)   Resp 11   Ht 6' (1.829 m)   Wt 117.9 kg   SpO2 92%   BMI 35.26 kg/m   Physical Exam General: NAD, A&O, resting, appropriate HEENT: [place]/AT, EOMI, MMM Pulmonary: Normal work of breathing Cardiovascular: HDS, adequate peripheral perfusion Abdomen: Soft, NTTP, nondistended, . GU: NO CVA tenderness Extremities: warm and well perfused Neuro: Appropriate, no focal neurological deficits  Most Recent Labs: No results found for: WBC, ADJUSTEDWBC, HGB, HCT, PLT  No results found for: NA, K, CL, CO2, BUN, CREATININE, CALCIUM, MG, PHOS  No results found for: INR, APTT   IMAGING: Ct Renal Stone Study  Result Date:  02/10/2019 CLINICAL DATA:  Left flank pain today. History of multiple renal stones. EXAM: CT ABDOMEN AND PELVIS WITHOUT CONTRAST TECHNIQUE: Multidetector CT imaging of the abdomen and pelvis was performed following the standard protocol without IV contrast. COMPARISON:  06/30/2017 FINDINGS: Lower chest: Lung bases are normal. Hepatobiliary: Liver, gallbladder and biliary tree are normal. Pancreas: Normal. Spleen: Normal. Adrenals/Urinary Tract: Adrenal glands are normal. Kidneys are normal in size with several small  right-sided renal stones present. A few small bilateral renal cortical hypodensities unchanged and likely cysts. No left renal stones. No left-sided hydronephrosis. There is mild right-sided hydronephrosis and mild dilatation of the proximal right ureter as there is a punctate stone over the proximal right ureter. The appearance of this tiny stone is not significantly changed from the prior exam. Left ureter and bladder are normal. Stomach/Bowel: Stomach and small bowel are normal. Appendix is normal. Minimal colonic diverticulosis. Vascular/Lymphatic: Normal. Reproductive: Prostate gland is normal. Evidence of previous vasectomy. Other: No free fluid or focal inflammatory change. Musculoskeletal: Mild degenerative change of the spine and hips. IMPRESSION: Right-sided nephrolithiasis with mild right-sided hydronephrosis. Punctate stone over the proximal right ureter likely causing this mild obstruction. Findings are similar to 2018. No left renal or ureteral stones visualized. Bilateral renal cysts unchanged. Mild colonic diverticulosis. Electronically Signed   By: Elberta Fortisaniel  Boyle M.D.   On: 02/10/2019 16:20    ------  Assessment:  53 y.o. male with longstanding history of nephrolithiasis.  Presented with acute right flank pain for 3 mm stone that is been there since 2018.  Clinically not showing any signs of infection on vitals, labs, urinalysis.  Pain was initially difficult to control but now improving.  Discussed options including ureteral stent placement, ureteroscopic stone extraction not emergently, medical expulsive therapy.  Estimate 70% chance of passing given size but may not pass given it has been there since 2018   At this time he would like to try to pass the stone on his own.  Recommendations: - Okay for discharge from urology perspective  -Please provide strainer, 0.4mg  Flomax daily   - 10 pills of oxycodone for pain control  - Return precautions for fever, uncontrolled pain,  nausea, vomiting  -Patient to call [facility] tomorrow to set up an appointment this week  - Explicitly told patient that we cannot provide refills of pain medication over the phone. If  he is still having pain by his follow-up appointment he will likely need to undergo ureteroscopic stone extraction.   Thank you for this consult. Please contact the urology consult pager with any further questions/concerns.

## 2019-02-11 LAB — URINE CULTURE: Culture: <10000 — AB

## 2019-03-25 ENCOUNTER — Encounter (HOSPITAL_COMMUNITY): Payer: Self-pay

## 2019-03-25 DIAGNOSIS — N132 Hydronephrosis with renal and ureteral calculous obstruction: Secondary | ICD-10-CM | POA: Insufficient documentation

## 2019-03-25 DIAGNOSIS — Z87442 Personal history of urinary calculi: Secondary | ICD-10-CM | POA: Diagnosis not present

## 2019-03-25 DIAGNOSIS — Z79899 Other long term (current) drug therapy: Secondary | ICD-10-CM | POA: Diagnosis not present

## 2019-03-25 DIAGNOSIS — R109 Unspecified abdominal pain: Secondary | ICD-10-CM | POA: Diagnosis present

## 2019-03-25 NOTE — ED Triage Notes (Signed)
Pt complains of right flank pain, hx of kidney stones and the pain is the same, pt also has noticed blood in his urine tonight

## 2019-03-26 ENCOUNTER — Emergency Department (HOSPITAL_COMMUNITY): Payer: 59

## 2019-03-26 ENCOUNTER — Emergency Department (HOSPITAL_COMMUNITY)
Admission: EM | Admit: 2019-03-26 | Discharge: 2019-03-26 | Disposition: A | Discharge status: Home / Self Care | Payer: 59 | Attending: Emergency Medicine | Admitting: Emergency Medicine

## 2019-03-26 DIAGNOSIS — N201 Calculus of ureter: Secondary | ICD-10-CM

## 2019-03-26 LAB — BASIC METABOLIC PANEL
Anion gap: 10 (ref 5–15)
BUN: 17 mg/dL (ref 6–20)
CO2: 26 mmol/L (ref 22–32)
Calcium: 8.9 mg/dL (ref 8.9–10.3)
Chloride: 106 mmol/L (ref 98–111)
Creatinine, Ser: 0.91 mg/dL (ref 0.61–1.24)
GFR calc Af Amer: >60 mL/min (ref >60)
GFR calc non Af Amer: >60 mL/min (ref >60)
Glucose, Bld: 92 mg/dL (ref 70–99)
Potassium: 4.1 mmol/L (ref 3.5–5.1)
Sodium: 142 mmol/L (ref 135–145)

## 2019-03-26 LAB — URINALYSIS, ROUTINE W REFLEX MICROSCOPIC
Bilirubin Urine: NEGATIVE
Glucose, UA: NEGATIVE mg/dL
Ketones, ur: NEGATIVE mg/dL
Leukocytes,Ua: NEGATIVE
Nitrite: NEGATIVE
Protein, ur: 100 mg/dL — AB
RBC / HPF: >50 RBC/hpf — ABNORMAL HIGH (ref 0–5)
Specific Gravity, Urine: 1.026 (ref 1.005–1.030)
pH: 5 (ref 5.0–8.0)

## 2019-03-26 MED ORDER — LIDOCAINE HCL 2 % IJ SOLN
1.5000 mg/kg | Freq: Once | INTRAMUSCULAR | Status: AC
  Administered 2019-03-26: 176 mg via INTRAVENOUS
  Filled 2019-03-26: qty 8.8

## 2019-03-26 MED ORDER — HYDROMORPHONE HCL 1 MG/ML IJ SOLN
1.0000 mg | INTRAMUSCULAR | Status: DC | PRN
  Administered 2019-03-26 (×2): 1 mg via INTRAVENOUS
  Filled 2019-03-26 (×2): qty 1

## 2019-03-26 MED ORDER — ONDANSETRON HCL 4 MG PO TABS: 4.0000 mg | ORAL_TABLET | Freq: Four times a day (QID) | ORAL | 0 refills | Status: DC

## 2019-03-26 MED ORDER — MORPHINE SULFATE (PF) 4 MG/ML IV SOLN
4.0000 mg | Freq: Once | INTRAVENOUS | Status: AC
  Administered 2019-03-26: 4 mg via INTRAVENOUS
  Filled 2019-03-26: qty 1

## 2019-03-26 MED ORDER — HYDROMORPHONE HCL 1 MG/ML IJ SOLN
1.0000 mg | Freq: Once | INTRAMUSCULAR | Status: AC
  Administered 2019-03-26: 1 mg via INTRAVENOUS
  Filled 2019-03-26: qty 1

## 2019-03-26 MED ORDER — TAMSULOSIN HCL 0.4 MG PO CAPS: 0.4000 mg | ORAL_CAPSULE | Freq: Every day | ORAL | 0 refills | Status: AC

## 2019-03-26 MED ORDER — KETOROLAC TROMETHAMINE 30 MG/ML IJ SOLN
30.0000 mg | Freq: Once | INTRAMUSCULAR | Status: AC
  Administered 2019-03-26: 30 mg via INTRAVENOUS
  Filled 2019-03-26: qty 1

## 2019-03-26 MED ORDER — OXYCODONE-ACETAMINOPHEN 5-325 MG PO TABS: 1.0000 | ORAL_TABLET | Freq: Four times a day (QID) | ORAL | 0 refills | Status: AC | PRN

## 2019-03-26 MED ORDER — ONDANSETRON HCL 4 MG/2ML IJ SOLN
4.0000 mg | Freq: Once | INTRAMUSCULAR | Status: AC
  Administered 2019-03-26: 4 mg via INTRAVENOUS
  Filled 2019-03-26: qty 2

## 2019-03-26 NOTE — ED Provider Notes (Addendum)
[facility]-EMERGENCY DEPT Provider Note   CSN: [id number] Arrival date & time: 03/25/19  2222     History   Chief Complaint Chief Complaint  Patient presents with   Flank Pain    HPI Bradley Frazier is a 53 y.o. male with a history of kidney stones who presents to the emergency department with a chief complaint of right flank pain.  The patient endorses right flank pain, onset yesterday.  He reports that he began having gross hematuria yesterday afternoon.  He reports that when the pain initially began that it was localized in his right flank, but as time is passed it has moved lower into the right lower quadrant.  He called his urologist and scheduled an outpatient appointment for the afternoon of September 1.  He reports that his symptoms were well managed at home, until last night when his right flank pain significantly worsens.  He reports that he was seen in the ER in July for a kidney stone, and staff had a difficult time controlling his pain.  He ultimately passed the stone at home.  He reports that imaging at that time demonstrated an additional stone in the right kidney, which he suspects is the source of his pain today.  He denies of fever, chills, vomiting, diarrhea, dysuria, urinary frequency or hesitancy, urinary retention, back pain, or penile or testicular pain or swelling.    He took ibuprofen at home for his symptoms with minimal improvement.    The history is provided by the patient. No language interpreter was used.    Past Medical History:  Diagnosis Date   Kidney calculi    Kidney stone     There are no active problems to display for this patient.   History reviewed. No pertinent surgical history.      Home Medications    Prior to Admission medications   Medication Sig Start Date End Date Taking? Authorizing Provider  ibuprofen (ADVIL) 600 MG tablet Take 1 tablet (600 mg total) by mouth every 8 (eight) hours as needed. Patient  taking differently: Take 600 mg by mouth every 8 (eight) hours as needed for moderate pain.  02/10/19  Yes Azalia Bilisampos, Kevin, MD  omeprazole (PRILOSEC OTC) 20 MG tablet Take 20 mg by mouth daily.   Yes [provider]  ondansetron (ZOFRAN) 4 MG tablet Take 1 tablet (4 mg total) by mouth every 6 (six) hours. 03/26/19   Arlyn DunningMcLean, Kelly A, PA-C  oxyCODONE-acetaminophen (PERCOCET/ROXICET) 5-325 MG tablet Take 1 tablet by mouth every 6 (six) hours as needed for up to 3 days for severe pain. 03/26/19 03/29/19  Arlyn DunningMcLean, Kelly A, PA-C  tamsulosin (FLOMAX) 0.4 MG CAPS capsule Take 1 capsule (0.4 mg total) by mouth daily after supper for 7 days. 03/26/19 04/02/19  Arlyn DunningMcLean, Kelly A, PA-C    Family History History reviewed. No pertinent family history.  Social History Social History   Tobacco Use   Smoking status: Never Smoker   Smokeless tobacco: Never Used  Substance Use Topics   Alcohol use: Yes   Drug use: No     Allergies   Patient has no known allergies.   Review of Systems Review of Systems  Constitutional: Negative for appetite change and fever.  Respiratory: Negative for shortness of breath.   Cardiovascular: Negative for chest pain.  Gastrointestinal: Positive for abdominal pain and nausea. Negative for anal bleeding, blood in stool, constipation, diarrhea and vomiting.  Genitourinary: Positive for flank pain. Negative for dysuria, hematuria, penile  pain, penile swelling, scrotal swelling and urgency.  Musculoskeletal: Negative for back pain.  Skin: Negative for rash.  Allergic/Immunologic: Negative for immunocompromised state.  Neurological: Negative for dizziness, seizures, weakness and headaches.  Psychiatric/Behavioral: Negative for confusion.     Physical Exam Updated Vital Signs BP 133/86    Pulse (!) 58    Temp 98.2 F (36.8 C) (Oral)    Resp 16    Ht 6' (1.829 m)    Wt 117.9 kg    SpO2 93%    BMI 35.26 kg/m   Physical Exam Vitals signs and nursing note reviewed.    Constitutional:      General: He is not in acute distress.    Appearance: He is well-developed. He is not ill-appearing, toxic-appearing or diaphoretic.     Comments: Writhing around on the bed. Uncomfortable appearing.   HENT:     Head: Normocephalic.  Eyes:     Conjunctiva/sclera: Conjunctivae normal.  Neck:     Musculoskeletal: Neck supple.  Cardiovascular:     Rate and Rhythm: Normal rate and regular rhythm.     Pulses: Normal pulses.     Heart sounds: Normal heart sounds. No murmur. No friction rub. No gallop.   Pulmonary:     Effort: Pulmonary effort is normal. No respiratory distress.     Breath sounds: No stridor. No wheezing, rhonchi or rales.  Chest:     Chest wall: No tenderness.  Abdominal:     General: There is no distension.     Palpations: Abdomen is soft. There is no mass.     Tenderness: There is abdominal tenderness. There is right CVA tenderness. There is no left CVA tenderness, guarding or rebound.     Hernia: No hernia is present.     Comments: Tender to palpation over the right CVA region.  No left CVA tenderness.  Tenderness to palpation wraps around the right flank into the right lower quadrant.  No tenderness over the suprapubic region.  No tenderness over McBurney's point.  He has no rebound or guarding.  Negative Murphy sign.  Normoactive bowel sounds in all 4 quadrants.  Abdomen is soft, nondistended.  Musculoskeletal:     Right lower leg: No edema.     Left lower leg: No edema.  Skin:    General: Skin is warm and dry.  Neurological:     Mental Status: He is alert.  Psychiatric:        Behavior: Behavior normal.      ED Treatments / Results  Labs (all labs ordered are listed, but only abnormal results are displayed) Labs Reviewed  URINALYSIS, ROUTINE W REFLEX MICROSCOPIC - Abnormal; Notable for the following components:      Result Value   APPearance HAZY (*)    Hgb urine dipstick LARGE (*)    Protein, ur 100 (*)    RBC / HPF >50 (*)     Bacteria, UA RARE (*)    All other components within normal limits  URINE CULTURE  BASIC METABOLIC PANEL    EKG None  Radiology Ct Renal Stone Study  Result Date: 03/26/2019 CLINICAL DATA:  Right-sided flank pain and hematuria EXAM: CT ABDOMEN AND PELVIS WITHOUT CONTRAST TECHNIQUE: Multidetector CT imaging of the abdomen and pelvis was performed following the standard protocol without IV contrast. COMPARISON:  02/10/2019 FINDINGS: Lower chest: Mild bibasilar atelectasis. Hepatobiliary: No focal liver abnormality is seen. No gallstones, gallbladder wall thickening, or biliary dilatation. Pancreas: Unremarkable. No pancreatic ductal dilatation or  surrounding inflammatory changes. Spleen: Normal in size without focal abnormality. Adrenals/Urinary Tract: Adrenal glands are within normal limits. The left kidney shows no renal calculi or obstructive changes. Few small hypodensities are noted in the left kidney likely representing small cysts. The bladder is decompressed. Right kidney demonstrates minimal fullness of the collecting system. Nonobstructing renal calculi are seen on the right. One of the smaller stone seen previously within the kidney has now migrated into the mid to distal ureter best seen on image number 70 of series 2 and image number 92 of series 5. This measures approximately 2-3 mm in dimension. The degree of hydronephrosis is minimal and improved from the prior study. Stomach/Bowel: Mild diverticular change of the colon is noted. No obstructive or inflammatory changes are seen. The appendix is within normal limits. Small bowel and stomach are unremarkable. Vascular/Lymphatic: No significant vascular findings are present. No enlarged abdominal or pelvic lymph nodes. Reproductive: Prostate is unremarkable. Other: No abdominal wall hernia or abnormality. No abdominopelvic ascites. Musculoskeletal: No acute or significant osseous findings. IMPRESSION: Interval migration of a 2-3 mm stone from  the right kidney to the mid to distal right ureter as described. Otherwise stable nonobstructing right renal calculi. Electronically Signed   By: Alcide CleverMark  Lukens M.D.   On: 03/26/2019 09:21    Procedures Procedures (including critical care time)  Medications Ordered in ED Medications  HYDROmorphone (DILAUDID) injection 1 mg (1 mg Intravenous Given 03/26/19 0218)  lidocaine (XYLOCAINE) 176 mg in sodium chloride 0.9 % 100 mL IVPB (0 mg/kg  117.9 kg Intravenous Stopped 03/26/19 0325)  ondansetron (ZOFRAN) injection 4 mg (4 mg Intravenous Given 03/26/19 0218)  ketorolac (TORADOL) 30 MG/ML injection 30 mg (30 mg Intravenous Given 03/26/19 0337)  morphine 4 MG/ML injection 4 mg (4 mg Intravenous Given 03/26/19 0659)     Initial Impression / Assessment and Plan / ED Course  I have reviewed the triage vital signs and the nursing notes.  Pertinent labs & imaging results that were available during my care of the patient were reviewed by me and considered in my medical decision making (see chart for details).  Clinical Course as of Mar 25 2201  Tue Mar 26, 2019  0711 Right flank pain, hx kidney stone with residual punctate right stone. F/u urology    [KM]  [phone] I spoke with urology in regards to this patient and they recommend repeat CT as well as CMP. Patient reports pain now 5/10, vomiting controlled.    [KM]  0930 Interval migration of a 2-3 mm stone from the right kidney to the mid to distal right ureter as described.  There is decreased hydronephrosis.  Patient reports that he can follow-up with urology at his outpatient appointment today at 245 as his pain is now controlled. No need for abx at this time, BMP normal     [KM]  0934 I discussed treatment options for the patients pain to include narcotic and non-narcotic medications. I discussed the risks of narcotic medications in full detail to include overdose and addiction. Patient verbalized full understanding of risks of narcotic medication but still  insisted to take rx for narcotic pain medication due to the severity of their pain.  Prior to providing a prescription for a controlled substance, I independently reviewed the patient's recent prescription history on the [place] Controlled Substance Reporting System. The patient had no recent or regular prescriptions and was deemed appropriate for a brief, less than 3 day prescription of narcotic for acute analgesia.     [  KM]    Clinical Course User Index [KM] Arlyn Dunning, PA-C       53 year old male with a history of recurrent kidney stones presenting with right flank pain and gross hematuria, onset within the last 24 hours.  No constitutional symptoms.  Patient's been hemodynamically stable since arrival in the ER.  Patient does note that he had a CT scan for a kidney stone in the last month when he was seen in the ER.  He ultimately passed that stone, but did note that staff had told him that he had other stones in the right kidney at that time.  He states the symptoms feel like previous kidney stones coughing.  UA with hemoglobinuria and RBCs.  Not concerning for infection.  Previous renal stone study reviewed by me, which demonstrated nephrolithiasis in the right kidney.  Shared decision-making conversation with the patient regarding repeating imaging, which patient does not feel is necessary at this time, which I think is reasonable.  He does have a follow-up appointment with urology within 12 hours.  We will plan for pain control in the ER and likely follow-up with urology in the clinic.  Patient did note that pain was difficult to control during his last ER visit.  Most recent ER visit reviewed.  During the patient's ER stay, he has had lidocaine for renal colic in addition to 2 doses of hydromorphone, and Toradol.  Pain initially seemed to be significantly improving; however, after fluid challenge was attempted, pain significantly worsened and became unmanageable.  Given  uncontrolled pain on the ER, will consult urology.  I have a low suspicion for pyelonephritis, urosepsis or appendicitis at this time.  Patient care transferred to [facility] at the end of my shift.  Will also adjust the patient's pain medication to morphine to see if the patient is able to get better control of his pain.  Patient presentation, ED course, and plan of care discussed with review of all pertinent labs and imaging. Please see his/her note for further details regarding further ED course and disposition.  Final Clinical Impressions(s) / ED Diagnoses   Final diagnoses:  Ureterolithiasis    ED Discharge Orders         Ordered    oxyCODONE-acetaminophen (PERCOCET/ROXICET) 5-325 MG tablet  Every 6 hours PRN     03/26/19 0936    tamsulosin (FLOMAX) 0.4 MG CAPS capsule  Daily after supper     03/26/19 0936    ondansetron (ZOFRAN) 4 MG tablet  Every 6 hours     03/26/19 0936           Yarielis Funaro A, PA-C 03/26/19 3582    Barkley Boards, PA-C 03/26/19 0848    Tiamarie Furnari A, PA-C 03/26/19 2202    Mesner, Barbara Cower, MD 03/26/19 2344

## 2019-03-26 NOTE — ED Provider Notes (Signed)
[facility]-EMERGENCY DEPT Provider Note   CSN: [id number] Arrival date & time: 03/25/19  2222     History   Chief Complaint Chief Complaint  Patient presents with  . Flank Pain    HPI Bradley Frazier is a 53 y.o. male. With PMH recurrent kidney stones.  Presented with acute right-sided flank pain which is similar to kidney stone pain that he has had in the past.  One episode of vomiting.  Patient was given several doses of pain medication prior to shift handoff.  Reports that pain is minimally improved.  He does report this feels very similar to previous stones in the past.  No signs of any infection in the urine or fever.  Will consult urology.     HPI  Past Medical History:  Diagnosis Date  . Kidney calculi   . Kidney stone     There are no active problems to display for this patient.   History reviewed. No pertinent surgical history.      Home Medications    Prior to Admission medications   Medication Sig Start Date End Date Taking? Authorizing Provider  ibuprofen (ADVIL) 600 MG tablet Take 1 tablet (600 mg total) by mouth every 8 (eight) hours as needed. Patient taking differently: Take 600 mg by mouth every 8 (eight) hours as needed for moderate pain.  02/10/19  Yes Azalia Bilis, MD  omeprazole (PRILOSEC OTC) 20 MG tablet Take 20 mg by mouth daily.   Yes [provider]  ondansetron (ZOFRAN) 4 MG tablet Take 1 tablet (4 mg total) by mouth every 6 (six) hours. 03/26/19   Arlyn Dunning, PA-C  oxyCODONE-acetaminophen (PERCOCET/ROXICET) 5-325 MG tablet Take 1 tablet by mouth every 6 (six) hours as needed for up to 3 days for severe pain. 03/26/19 03/29/19  Arlyn Dunning, PA-C  tamsulosin (FLOMAX) 0.4 MG CAPS capsule Take 1 capsule (0.4 mg total) by mouth daily after supper for 7 days. 03/26/19 04/02/19  Arlyn Dunning, PA-C    Family History History reviewed. No pertinent family history.  Social History Social History   Tobacco Use  . Smoking  status: Never Smoker  . Smokeless tobacco: Never Used  Substance Use Topics  . Alcohol use: Yes  . Drug use: No     Allergies   Patient has no known allergies.   Review of Systems Review of Systems   Physical Exam Updated Vital Signs BP 130/89   Pulse (!) 57   Temp 98.2 F (36.8 C) (Oral)   Resp 16   Ht 6' (1.829 m)   Wt 117.9 kg   SpO2 91%   BMI 35.26 kg/m   Physical Exam   ED Treatments / Results  Labs (all labs ordered are listed, but only abnormal results are displayed) Labs Reviewed  URINALYSIS, ROUTINE W REFLEX MICROSCOPIC - Abnormal; Notable for the following components:      Result Value   APPearance HAZY (*)    Hgb urine dipstick LARGE (*)    Protein, ur 100 (*)    RBC / HPF >50 (*)    Bacteria, UA RARE (*)    All other components within normal limits  URINE CULTURE  BASIC METABOLIC PANEL    EKG None  Radiology Ct Renal Stone Study  Result Date: 03/26/2019 CLINICAL DATA:  Right-sided flank pain and hematuria EXAM: CT ABDOMEN AND PELVIS WITHOUT CONTRAST TECHNIQUE: Multidetector CT imaging of the abdomen and pelvis was performed following the standard protocol without IV  contrast. COMPARISON:  02/10/2019 FINDINGS: Lower chest: Mild bibasilar atelectasis. Hepatobiliary: No focal liver abnormality is seen. No gallstones, gallbladder wall thickening, or biliary dilatation. Pancreas: Unremarkable. No pancreatic ductal dilatation or surrounding inflammatory changes. Spleen: Normal in size without focal abnormality. Adrenals/Urinary Tract: Adrenal glands are within normal limits. The left kidney shows no renal calculi or obstructive changes. Few small hypodensities are noted in the left kidney likely representing small cysts. The bladder is decompressed. Right kidney demonstrates minimal fullness of the collecting system. Nonobstructing renal calculi are seen on the right. One of the smaller stone seen previously within the kidney has now migrated into the mid to  distal ureter best seen on image number 70 of series 2 and image number 92 of series 5. This measures approximately 2-3 mm in dimension. The degree of hydronephrosis is minimal and improved from the prior study. Stomach/Bowel: Mild diverticular change of the colon is noted. No obstructive or inflammatory changes are seen. The appendix is within normal limits. Small bowel and stomach are unremarkable. Vascular/Lymphatic: No significant vascular findings are present. No enlarged abdominal or pelvic lymph nodes. Reproductive: Prostate is unremarkable. Other: No abdominal wall hernia or abnormality. No abdominopelvic ascites. Musculoskeletal: No acute or significant osseous findings. IMPRESSION: Interval migration of a 2-3 mm stone from the right kidney to the mid to distal right ureter as described. Otherwise stable nonobstructing right renal calculi. Electronically Signed   By: Alcide CleverMark  Lukens M.D.   On: 03/26/2019 09:21    Procedures Procedures (including critical care time)  Medications Ordered in ED Medications  HYDROmorphone (DILAUDID) injection 1 mg (1 mg Intravenous Given 03/26/19 0914)  HYDROmorphone (DILAUDID) injection 1 mg (1 mg Intravenous Given 03/26/19 0218)  lidocaine (XYLOCAINE) 176 mg in sodium chloride 0.9 % 100 mL IVPB (0 mg/kg  117.9 kg Intravenous Stopped 03/26/19 0325)  ondansetron (ZOFRAN) injection 4 mg (4 mg Intravenous Given 03/26/19 0218)  ketorolac (TORADOL) 30 MG/ML injection 30 mg (30 mg Intravenous Given 03/26/19 0337)  morphine 4 MG/ML injection 4 mg (4 mg Intravenous Given 03/26/19 0659)     Initial Impression / Assessment and Plan / ED Course  I have reviewed the triage vital signs and the nursing notes.  Pertinent labs & imaging results that were available during my care of the patient were reviewed by me and considered in my medical decision making (see chart for details).  Clinical Course as of Mar 26 939  Tue Mar 26, 2019  0711 Right flank pain, hx kidney stone with  residual punctate right stone. F/u urology    [KM]  [phone] I spoke with urology in regards to this patient and they recommend repeat CT as well as CMP. Patient reports pain now 5/10, vomiting controlled.    [KM]  0930 Interval migration of a 2-3 mm stone from the right kidney to the mid to distal right ureter as described.  There is decreased hydronephrosis.  Patient reports that he can follow-up with urology at his outpatient appointment today at 245 as his pain is now controlled. No need for abx at this time, BMP normal     [KM]  0934 I discussed treatment options for the patients pain to include narcotic and non-narcotic medications. I discussed the risks of narcotic medications in full detail to include overdose and addiction. Patient verbalized full understanding of risks of narcotic medication but still insisted to take rx for narcotic pain medication due to the severity of their pain.  Prior to providing a prescription for a  controlled substance, I independently reviewed the patient's recent prescription history on the [place]. The patient had no recent or regular prescriptions and was deemed appropriate for a brief, less than 3 day prescription of narcotic for acute analgesia.     [KM]    Clinical Course User Index [KM] Alveria Apley, PA-C       Based on review of vitals, medical screening exam, lab work and/or imaging, there does not appear to be an acute need for admission. Counseled pt on good return precautions and encouraged both PCP and ED follow-up as needed.  Prior to discharge, I also discussed incidental imaging findings with patient in detail and advised appropriate, recommended follow-up in detail.  Clinical Impression: 1. Ureterolithiasis     Disposition: Discharge  Prior to providing a prescription for a controlled substance, I independently reviewed the patient's recent prescription history on the [place]. The patient had no recent or regular prescriptions and was deemed appropriate for a brief, less than 3 day prescription of narcotic for acute analgesia.  This note was prepared with assistance of Systems analyst. Occasional wrong-word or sound-a-like substitutions may have occurred due to the inherent limitations of voice recognition software.   Final Clinical Impressions(s) / ED Diagnoses   Final diagnoses:  Ureterolithiasis    ED Discharge Orders         Ordered    oxyCODONE-acetaminophen (PERCOCET/ROXICET) 5-325 MG tablet  Every 6 hours PRN     03/26/19 0936    tamsulosin (FLOMAX) 0.4 MG CAPS capsule  Daily after supper     03/26/19 0936    ondansetron (ZOFRAN) 4 MG tablet  Every 6 hours     03/26/19 0936           Alveria Apley, PA-C 03/26/19 0940    Little, Wenda Overland, MD 03/27/19 [phone]

## 2019-03-26 NOTE — Discharge Instructions (Signed)
Please do not miss your urology appointment this afternoon.  If you have any worsening symptoms return to the emergency department.

## 2019-03-27 LAB — URINE CULTURE: Culture: NO GROWTH

## 2019-07-15 ENCOUNTER — Other Ambulatory Visit: Payer: Self-pay

## 2019-07-15 ENCOUNTER — Emergency Department (HOSPITAL_COMMUNITY)
Admission: EM | Admit: 2019-07-15 | Discharge: 2019-07-15 | Disposition: A | Discharge status: Home / Self Care | Payer: 59 | Attending: Emergency Medicine | Admitting: Emergency Medicine

## 2019-07-15 ENCOUNTER — Emergency Department (HOSPITAL_COMMUNITY): Payer: 59

## 2019-07-15 ENCOUNTER — Encounter (HOSPITAL_COMMUNITY): Payer: Self-pay

## 2019-07-15 DIAGNOSIS — Z79899 Other long term (current) drug therapy: Secondary | ICD-10-CM | POA: Diagnosis not present

## 2019-07-15 DIAGNOSIS — Z87442 Personal history of urinary calculi: Secondary | ICD-10-CM | POA: Insufficient documentation

## 2019-07-15 DIAGNOSIS — N200 Calculus of kidney: Secondary | ICD-10-CM | POA: Insufficient documentation

## 2019-07-15 DIAGNOSIS — R1031 Right lower quadrant pain: Secondary | ICD-10-CM | POA: Diagnosis present

## 2019-07-15 LAB — CBC WITH DIFFERENTIAL/PLATELET
Abs Immature Granulocytes: 0.06 K/uL (ref 0.00–0.07)
Basophils Absolute: 0 K/uL (ref 0.0–0.1)
Basophils Relative: 0 %
Eosinophils Absolute: 0.1 K/uL (ref 0.0–0.5)
Eosinophils Relative: 0 %
HCT: 47.8 % (ref 39.0–52.0)
Hemoglobin: 15.7 g/dL (ref 13.0–17.0)
Immature Granulocytes: 1 %
Lymphocytes Relative: 8 %
Lymphs Abs: 1 K/uL (ref 0.7–4.0)
MCH: 30.3 pg (ref 26.0–34.0)
MCHC: 32.8 g/dL (ref 30.0–36.0)
MCV: 92.1 fL (ref 80.0–100.0)
Monocytes Absolute: 0.8 K/uL (ref 0.1–1.0)
Monocytes Relative: 7 %
Neutro Abs: 10 K/uL — ABNORMAL HIGH (ref 1.7–7.7)
Neutrophils Relative %: 84 %
Platelets: 205 K/uL (ref 150–400)
RBC: 5.19 MIL/uL (ref 4.22–5.81)
RDW: 12.8 % (ref 11.5–15.5)
WBC: 12 K/uL — ABNORMAL HIGH (ref 4.0–10.5)
nRBC: 0 % (ref 0.0–0.2)

## 2019-07-15 LAB — BASIC METABOLIC PANEL
Anion gap: 7 (ref 5–15)
BUN: 15 mg/dL (ref 6–20)
CO2: 28 mmol/L (ref 22–32)
Calcium: 9.2 mg/dL (ref 8.9–10.3)
Chloride: 107 mmol/L (ref 98–111)
Creatinine, Ser: 1.38 mg/dL — ABNORMAL HIGH (ref 0.61–1.24)
GFR calc Af Amer: >60 mL/min (ref >60)
GFR calc non Af Amer: 59 mL/min — ABNORMAL LOW (ref >60)
Glucose, Bld: 126 mg/dL — ABNORMAL HIGH (ref 70–99)
Potassium: 4.3 mmol/L (ref 3.5–5.1)
Sodium: 142 mmol/L (ref 135–145)

## 2019-07-15 LAB — URINALYSIS, ROUTINE W REFLEX MICROSCOPIC
Bacteria, UA: NONE SEEN
Bilirubin Urine: NEGATIVE
Glucose, UA: NEGATIVE mg/dL
Ketones, ur: 5 mg/dL — AB
Nitrite: NEGATIVE
Protein, ur: 30 mg/dL — AB
Specific Gravity, Urine: 1.021 (ref 1.005–1.030)
pH: 6 (ref 5.0–8.0)

## 2019-07-15 MED ORDER — ONDANSETRON HCL 4 MG/2ML IJ SOLN
4.0000 mg | Freq: Once | INTRAMUSCULAR | Status: AC
  Administered 2019-07-15: 18:00:00 4 mg via INTRAVENOUS
  Filled 2019-07-15: qty 2

## 2019-07-15 MED ORDER — KETOROLAC TROMETHAMINE 30 MG/ML IJ SOLN
30.0000 mg | Freq: Once | INTRAMUSCULAR | Status: AC
  Administered 2019-07-15: 18:00:00 30 mg via INTRAVENOUS
  Filled 2019-07-15: qty 1

## 2019-07-15 MED ORDER — HYDROMORPHONE HCL 1 MG/ML IJ SOLN
1.0000 mg | Freq: Once | INTRAMUSCULAR | Status: AC
  Administered 2019-07-15: 20:00:00 1 mg via INTRAVENOUS
  Filled 2019-07-15: qty 1

## 2019-07-15 MED ORDER — HYDROMORPHONE HCL 1 MG/ML IJ SOLN
1.0000 mg | Freq: Once | INTRAMUSCULAR | Status: AC
  Administered 2019-07-15: 18:00:00 1 mg via INTRAVENOUS
  Filled 2019-07-15: qty 1

## 2019-07-15 NOTE — Discharge Instructions (Signed)
Follow with your urologist tomorrow as planned

## 2019-07-15 NOTE — ED Triage Notes (Signed)
Patient states that he has a known right kidney stone and having right flank pain since noon today. Patient denies any problems with urinating.

## 2019-07-15 NOTE — ED Provider Notes (Signed)
[facility] DEPT Provider Note   CSN: [id number] Arrival date & time: 07/15/19  1454     History Chief Complaint  Patient presents with  . Flank Pain    Bradley Frazier is a 53 y.o. male.  HPI Patient has known kidney stones.  Is been having pain since Thursday with today being Monday.  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  States he was taking 10 mg oxycodones twice a day.  That did well on Thursday Friday and Saturday.  States yesterday he did not take the medicines and thought he was doing better.  States today though developed more severe pain still in the right flank but may be more down the abdomen.  No dysuria.  No testicular pain.  He has an appointment tomorrow with his urologist.    Past Medical History:  Diagnosis Date  . Kidney calculi   . Kidney stone     There are no problems to display for this patient.   History reviewed. No pertinent surgical history.     Family History  Problem Relation Age of Onset  . Healthy Mother   . Diabetes Father     Social History   Tobacco Use  . Smoking status: Never Smoker  . Smokeless tobacco: Never Used  Substance Use Topics  . Alcohol use: Yes  . Drug use: No    Home Medications Prior to Admission medications   Medication Sig Start Date End Date Taking? Authorizing Provider  ibuprofen (ADVIL) 200 MG tablet Take 400 mg by mouth every 6 (six) hours as needed for moderate pain.   Yes [provider]  omeprazole (PRILOSEC OTC) 20 MG tablet Take 20 mg by mouth daily.   Yes [provider]  Oxycodone HCl 10 MG TABS Take 10 mg by mouth every 6 (six) hours as needed for severe pain. 07/11/19  Yes [provider]    Allergies    Patient has no known allergies.  Review of Systems   Review of Systems  Constitutional: Negative for appetite change.  HENT: Negative for congestion.   Respiratory: Negative for shortness of breath.     Gastrointestinal: Positive for abdominal pain.  Genitourinary: Positive for flank pain. Negative for testicular pain.  Musculoskeletal: Negative for back pain.  Skin: Negative for rash.  Neurological: Negative for weakness.  Psychiatric/Behavioral: Negative for confusion.    Physical Exam Updated Vital Signs BP (!) 161/84   Pulse 62   Temp 98 F (36.7 C) (Oral)   Resp 17   Ht 6' (1.829 m)   Wt 117.9 kg   SpO2 92%   BMI 35.26 kg/m   Physical Exam Vitals and nursing note reviewed.  HENT:     Head: Normocephalic.  Eyes:     Pupils: Pupils are equal, round, and reactive to light.  Cardiovascular:     Rate and Rhythm: Regular rhythm.  Pulmonary:     Breath sounds: Normal breath sounds.  Abdominal:     Tenderness: There is no abdominal tenderness.  Genitourinary:    Comments: No testicular pain.  No clear CVA tenderness. Musculoskeletal:     Cervical back: Neck supple.  Neurological:     Mental Status: He is alert.     ED Results / Procedures / Treatments   Labs (all labs ordered are listed, but only abnormal results are displayed) Labs Reviewed  URINALYSIS, ROUTINE W REFLEX MICROSCOPIC - Abnormal; Notable for the following components:  Result Value   Hgb urine dipstick SMALL (*)    Ketones, ur 5 (*)    Protein, ur 30 (*)    Leukocytes,Ua TRACE (*)    All other components within normal limits  CBC WITH DIFFERENTIAL/PLATELET - Abnormal; Notable for the following components:   WBC 12.0 (*)    Neutro Abs 10.0 (*)    All other components within normal limits  BASIC METABOLIC PANEL - Abnormal; Notable for the following components:   Glucose, Bld 126 (*)    Creatinine, Ser 1.38 (*)    GFR calc non Af Amer 59 (*)    All other components within normal limits    EKG None  Radiology DG Abdomen 1 View  Result Date: 07/15/2019 CLINICAL DATA:  Right flank pain since noon today. Known right kidney stone. EXAM: ABDOMEN - 1 VIEW COMPARISON:  03/14/2016. Abdomen  and pelvis CT dated 03/26/2019. FINDINGS: Normal bowel gas pattern. 5 mm calcific density to the right of the lumbar spine at the inferior L2 level, in the expected position of the right ureteropelvic junction or proximal ureter. There is also a tiny calcific density in the inferior right pelvis in the expected position of the distal right ureter. No other calcified urinary tract calculi are visible. Unremarkable bones. IMPRESSION: 1. 5 mm possible calculus in the region of the right UPJ or proximal ureter. 2. Possible tiny distal right ureteral calculus. Electronically Signed   By: Beckie SaltsSteven  Reid M.D.   On: 07/15/2019 18:34   US Renal  Result Date: 07/15/2019 CLINICAL DATA:  53 year old male with right flank pain. History of kidney stones. EXAM: RENAL / URINARY TRACT ULTRASOUND COMPLETE COMPARISON:  CT abdomen pelvis dated 03/26/2019. FINDINGS: Right Kidney: Renal measurements: 13.7 x 6.8 x 7.5 cm = volume: 368 mL. There is mild right hydronephrosis. No shadowing stone. Normal echogenicity. Left Kidney: Renal measurements: 13.1 x 6.8 x 6.8 cm = volume: 316 mL. Normal echogenicity. No hydronephrosis or shadowing stone. Bladder: Appears normal for degree of bladder distention. Bilateral ureteral jets visualized. Other: The prostate gland measures 4.2 x 3.3 x 4.9 cm for a volume of 35 cc. IMPRESSION: Mild right hydronephrosis.  No shadowing stone. Electronically Signed   By: Elgie CollardArash  Radparvar M.D.   On: 07/15/2019 19:19    Procedures Procedures (including critical care time)  Medications Ordered in ED Medications  ketorolac (TORADOL) 30 MG/ML injection 30 mg (30 mg Intravenous Given 07/15/19 1824)  HYDROmorphone (DILAUDID) injection 1 mg (1 mg Intravenous Given 07/15/19 1827)  ondansetron (ZOFRAN) injection 4 mg (4 mg Intravenous Given 07/15/19 1824)  HYDROmorphone (DILAUDID) injection 1 mg (1 mg Intravenous Given 07/15/19 1935)    ED Course  I have reviewed the triage vital signs and the nursing  notes.  Pertinent labs & imaging results that were available during my care of the patient were reviewed by me and considered in my medical decision making (see chart for details).    MDM Rules/Calculators/A&P                      Patient with right flank pain.  Known previous kidney stones.  Creatinine mildly elevated.  No infection.  Feels better after treatment.  Patient states he is feeling better and does not need more pain medicines for home and has a appointment with his urologist tomorrow. Final Clinical Impression(s) / ED Diagnoses Final diagnoses:  Kidney stone    Rx / DC Orders ED Discharge Orders    None  Benjiman Core, MD 07/15/19 2025

## 2020-04-20 ENCOUNTER — Other Ambulatory Visit: Payer: Self-pay

## 2020-04-20 ENCOUNTER — Emergency Department (HOSPITAL_COMMUNITY)
Admission: EM | Admit: 2020-04-20 | Discharge: 2020-04-20 | Disposition: A | Discharge status: Home / Self Care | Payer: 59 | Attending: Emergency Medicine | Admitting: Emergency Medicine

## 2020-04-20 ENCOUNTER — Emergency Department (HOSPITAL_COMMUNITY): Payer: 59

## 2020-04-20 ENCOUNTER — Encounter (HOSPITAL_COMMUNITY): Payer: Self-pay | Admitting: Emergency Medicine

## 2020-04-20 DIAGNOSIS — R109 Unspecified abdominal pain: Secondary | ICD-10-CM | POA: Diagnosis present

## 2020-04-20 DIAGNOSIS — N201 Calculus of ureter: Secondary | ICD-10-CM

## 2020-04-20 DIAGNOSIS — N132 Hydronephrosis with renal and ureteral calculous obstruction: Secondary | ICD-10-CM | POA: Insufficient documentation

## 2020-04-20 LAB — CBC WITH DIFFERENTIAL/PLATELET
Abs Immature Granulocytes: 0.04 10*3/uL (ref 0.00–0.07)
Basophils Absolute: 0 10*3/uL (ref 0.0–0.1)
Basophils Relative: 0 %
Eosinophils Absolute: 0.1 10*3/uL (ref 0.0–0.5)
Eosinophils Relative: 1 %
HCT: 49.1 % (ref 39.0–52.0)
Hemoglobin: 16.8 g/dL (ref 13.0–17.0)
Immature Granulocytes: 0 %
Lymphocytes Relative: 9 %
Lymphs Abs: 0.9 10*3/uL (ref 0.7–4.0)
MCH: 30.9 pg (ref 26.0–34.0)
MCHC: 34.2 g/dL (ref 30.0–36.0)
MCV: 90.4 fL (ref 80.0–100.0)
Monocytes Absolute: 0.6 10*3/uL (ref 0.1–1.0)
Monocytes Relative: 6 %
Neutro Abs: 8.3 10*3/uL — ABNORMAL HIGH (ref 1.7–7.7)
Neutrophils Relative %: 84 %
Platelets: 172 10*3/uL (ref 150–400)
RBC: 5.43 MIL/uL (ref 4.22–5.81)
RDW: 13.2 % (ref 11.5–15.5)
WBC: 9.8 10*3/uL (ref 4.0–10.5)
nRBC: 0 % (ref 0.0–0.2)

## 2020-04-20 LAB — URINALYSIS, ROUTINE W REFLEX MICROSCOPIC
Bilirubin Urine: NEGATIVE
Glucose, UA: NEGATIVE mg/dL
Ketones, ur: NEGATIVE mg/dL
Leukocytes,Ua: NEGATIVE
Nitrite: NEGATIVE
Protein, ur: 100 mg/dL — AB
RBC / HPF: >50 RBC/hpf — ABNORMAL HIGH (ref 0–5)
Specific Gravity, Urine: 1.028 (ref 1.005–1.030)
pH: 6 (ref 5.0–8.0)

## 2020-04-20 LAB — COMPREHENSIVE METABOLIC PANEL
ALT: 29 U/L (ref 0–44)
AST: 26 U/L (ref 15–41)
Albumin: 4 g/dL (ref 3.5–5.0)
Alkaline Phosphatase: 74 U/L (ref 38–126)
Anion gap: 11 (ref 5–15)
BUN: 16 mg/dL (ref 6–20)
CO2: 24 mmol/L (ref 22–32)
Calcium: 8.9 mg/dL (ref 8.9–10.3)
Chloride: 106 mmol/L (ref 98–111)
Creatinine, Ser: 1.04 mg/dL (ref 0.61–1.24)
GFR calc Af Amer: >60 mL/min (ref >60)
GFR calc non Af Amer: >60 mL/min (ref >60)
Glucose, Bld: 113 mg/dL — ABNORMAL HIGH (ref 70–99)
Potassium: 4.2 mmol/L (ref 3.5–5.1)
Sodium: 141 mmol/L (ref 135–145)
Total Bilirubin: 0.6 mg/dL (ref 0.3–1.2)
Total Protein: 7.1 g/dL (ref 6.5–8.1)

## 2020-04-20 LAB — LIPASE, BLOOD: Lipase: 37 U/L (ref 11–51)

## 2020-04-20 MED ORDER — NAPROXEN 375 MG PO TABS: 375.0000 mg | ORAL_TABLET | Freq: Two times a day (BID) | ORAL | 0 refills | Status: AC

## 2020-04-20 MED ORDER — ONDANSETRON HCL 4 MG/2ML IJ SOLN
4.0000 mg | Freq: Once | INTRAMUSCULAR | Status: AC
  Administered 2020-04-20: 4 mg via INTRAVENOUS
  Filled 2020-04-20: qty 2

## 2020-04-20 MED ORDER — OXYCODONE-ACETAMINOPHEN 5-325 MG PO TABS
1.0000 | ORAL_TABLET | ORAL | Status: DC | PRN
  Administered 2020-04-20: 1 via ORAL
  Filled 2020-04-20: qty 1

## 2020-04-20 MED ORDER — KETOROLAC TROMETHAMINE 30 MG/ML IJ SOLN
15.0000 mg | Freq: Once | INTRAMUSCULAR | Status: AC
  Administered 2020-04-20: 15 mg via INTRAVENOUS
  Filled 2020-04-20: qty 1

## 2020-04-20 MED ORDER — HYDROMORPHONE HCL 1 MG/ML IJ SOLN
1.0000 mg | Freq: Once | INTRAMUSCULAR | Status: AC
  Administered 2020-04-20: 1 mg via INTRAVENOUS
  Filled 2020-04-20: qty 1

## 2020-04-20 MED ORDER — SODIUM CHLORIDE 0.9 % IV SOLN: INTRAVENOUS | Status: DC

## 2020-04-20 MED ORDER — ONDANSETRON 8 MG PO TBDP: 8.0000 mg | ORAL_TABLET | Freq: Three times a day (TID) | ORAL | 0 refills | Status: AC | PRN

## 2020-04-20 MED ORDER — SODIUM CHLORIDE 0.9 % IV BOLUS
500.0000 mL | Freq: Once | INTRAVENOUS | Status: AC
  Administered 2020-04-20: 500 mL via INTRAVENOUS

## 2020-04-20 MED ORDER — HYDROCODONE-ACETAMINOPHEN 5-325 MG PO TABS: 1.0000 | ORAL_TABLET | Freq: Four times a day (QID) | ORAL | 0 refills | Status: AC | PRN

## 2020-04-20 NOTE — ED Provider Notes (Signed)
[facility]-EMERGENCY DEPT Provider Note   CSN: [id number] Arrival date & time: 04/20/20  9622     History Chief complaint: Kidney stone pain  Sante Biedermann is a 54 y.o. male.   Flank Pain  Patient presented to ED with complaints of pain associated with a kidney stone.  Patient has history of prior kidney stones with last being approximately a year ago.  Patient woke up this morning about 4 AM with pain very similar to prior kidney stones.  The pain is in the left lower abdomen and left flank.  Patient had an episode of nausea and vomiting this morning.  The pain is severe.  He denies any fevers or chills.  No diarrhea.  He is not having difficulty urinating.  Patient denies any other complaints.     Past Medical History:  Diagnosis Date  . Kidney calculi   . Kidney stone     There are no problems to display for this patient.   History reviewed. No pertinent surgical history.     Family History  Problem Relation Age of Onset  . Healthy Mother   . Diabetes Father     Social History   Tobacco Use  . Smoking status: Never Smoker  . Smokeless tobacco: Never Used  Vaping Use  . Vaping Use: Never used  Substance Use Topics  . Alcohol use: Yes  . Drug use: No    Home Medications Prior to Admission medications   Medication Sig Start Date End Date Taking? Authorizing Provider  ibuprofen (ADVIL) 200 MG tablet Take 400 mg by mouth every 6 (six) hours as needed for moderate pain.   Yes [provider]  omeprazole (PRILOSEC OTC) 20 MG tablet Take 20 mg by mouth daily.   Yes [provider]  HYDROcodone-acetaminophen (NORCO/VICODIN) 5-325 MG tablet Take 1-2 tablets by mouth every 6 (six) hours as needed for severe pain. 04/20/20   Linwood Dibbles, MD  naproxen (NAPROSYN) 375 MG tablet Take 1 tablet (375 mg total) by mouth 2 (two) times daily. 04/20/20   Linwood Dibbles, MD  ondansetron (ZOFRAN ODT) 8 MG disintegrating tablet Take 1 tablet (8 mg total)  by mouth every 8 (eight) hours as needed for nausea or vomiting. 04/20/20   Linwood Dibbles, MD  Oxycodone HCl 10 MG TABS Take 10 mg by mouth every 6 (six) hours as needed for severe pain. Patient not taking: Reported on 04/20/2020 07/11/19   [provider]    Allergies    Patient has no known allergies.  Review of Systems   Review of Systems  Genitourinary: Positive for flank pain.  All other systems reviewed and are negative.   Physical Exam Updated Vital Signs BP (!) 145/95 (BP Location: Right Arm)   Pulse 69   Temp 97.7 F (36.5 C) (Oral)   Resp 18   Ht 1.829 m (6')   Wt 127 kg   SpO2 96%   BMI 37.97 kg/m   Physical Exam Vitals and nursing note reviewed.  Constitutional:      Appearance: He is well-developed.     Comments: Appears uncomfortable, in pain  HENT:     Head: Normocephalic and atraumatic.     Right Ear: External ear normal.     Left Ear: External ear normal.  Eyes:     General: No scleral icterus.       Right eye: No discharge.        Left eye: No discharge.  Conjunctiva/sclera: Conjunctivae normal.  Neck:     Trachea: No tracheal deviation.  Cardiovascular:     Rate and Rhythm: Normal rate and regular rhythm.  Pulmonary:     Effort: Pulmonary effort is normal. No respiratory distress.     Breath sounds: Normal breath sounds. No stridor. No wheezing or rales.  Abdominal:     General: Bowel sounds are normal. There is no distension.     Palpations: Abdomen is soft.     Tenderness: There is abdominal tenderness. There is left CVA tenderness. There is no guarding or rebound.     Comments: Left lower quadrant  Musculoskeletal:        General: No tenderness.     Cervical back: Neck supple.  Skin:    General: Skin is warm and dry.     Findings: No rash.  Neurological:     Mental Status: He is alert.     Cranial Nerves: No cranial nerve deficit (no facial droop, extraocular movements intact, no slurred speech).     Sensory: No sensory  deficit.     Motor: No abnormal muscle tone or seizure activity.     Coordination: Coordination normal.     ED Results / Procedures / Treatments   Labs (all labs ordered are listed, but only abnormal results are displayed) Labs Reviewed  URINALYSIS, ROUTINE W REFLEX MICROSCOPIC - Abnormal; Notable for the following components:      Result Value   APPearance HAZY (*)    Hgb urine dipstick LARGE (*)    Protein, ur 100 (*)    RBC / HPF >50 (*)    Bacteria, UA RARE (*)    All other components within normal limits  COMPREHENSIVE METABOLIC PANEL - Abnormal; Notable for the following components:   Glucose, Bld 113 (*)    All other components within normal limits  CBC WITH DIFFERENTIAL/PLATELET - Abnormal; Notable for the following components:   Neutro Abs 8.3 (*)    All other components within normal limits  URINE CULTURE  LIPASE, BLOOD    EKG None  Radiology CT Renal Stone Study  Result Date: 04/20/2020 CLINICAL DATA:  Flank pain with kidney stone suspected EXAM: CT ABDOMEN AND PELVIS WITHOUT CONTRAST TECHNIQUE: Multidetector CT imaging of the abdomen and pelvis was performed following the standard protocol without IV contrast. COMPARISON:  03/26/2019 and earlier FINDINGS: Lower chest:  No contributory findings. Hepatobiliary: No focal liver abnormality.No evidence of biliary obstruction or stone. Pancreas: Unremarkable. Spleen: Unremarkable. Adrenals/Urinary Tract: Negative adrenals. Mild left hydronephrosis from a 3 mm stone in the upper ureter, at the level of L3-4. There is a punctate left lower pole calculus. Small low-density left renal lesion which is much too small for densitometry, long-standing. There is an exophytic right lower pole lesion measuring 17 mm which is isodense on this study but convincingly cystic appearing on a 2018 enhanced CT, with effective size stability from that time. Negative bladder. Stomach/Bowel:  No obstruction. No appendicitis. Vascular/Lymphatic: No  acute vascular abnormality. No mass or adenopathy. Reproductive:No pathologic findings. Other: No ascites or pneumoperitoneum. Musculoskeletal: No acute abnormalities. IMPRESSION: 3 mm left upper ureteral calculus with mild hydronephrosis. Electronically Signed   By: Marnee Spring M.D.   On: 04/20/2020 10:37    Procedures Procedures (including critical care time)  Medications Ordered in ED Medications  oxyCODONE-acetaminophen (PERCOCET/ROXICET) 5-325 MG per tablet 1 tablet (1 tablet Oral Given 04/20/20 2202)  sodium chloride 0.9 % bolus 500 mL (0 mLs Intravenous Stopped 04/20/20 0930)  And  0.9 %  sodium chloride infusion ( Intravenous New Bag/Given 04/20/20 0957)  HYDROmorphone (DILAUDID) injection 1 mg (has no administration in time range)  ondansetron (ZOFRAN) injection 4 mg (4 mg Intravenous Given 04/20/20 0904)  HYDROmorphone (DILAUDID) injection 1 mg (1 mg Intravenous Given 04/20/20 0901)  ketorolac (TORADOL) 30 MG/ML injection 15 mg (15 mg Intravenous Given 04/20/20 0903)  HYDROmorphone (DILAUDID) injection 1 mg (1 mg Intravenous Given 04/20/20 1002)  ketorolac (TORADOL) 30 MG/ML injection 15 mg (15 mg Intravenous Given 04/20/20 1003)    ED Course  I have reviewed the triage vital signs and the nursing notes.  Pertinent labs & imaging results that were available during my care of the patient were reviewed by me and considered in my medical decision making (see chart for details).  Clinical Course as of Apr 20 1148  Mon Apr 20, 2020  1962 Ua notable to blood.  Labs otherwise normal   [JK]  0950 Pt still having pain.  Will give additional pain meds.  CT scan to confirm ureteral stone, assess for complications   [JK]  1101 CT scan confirms 3 mm stone   [JK]  1110 Pt requests additional pain med dose    [JK]    Clinical Course User Index [JK] Linwood Dibbles, MD   MDM Rules/Calculators/A&P                          Patient presented to ED with complaints of kidney stone pain.   Patient's ED work-up confirms a kidney stone.  Urinalysis showed findings of hematuria but I doubt infection.  CBC and metabolic panel reassuring.  CT scan does show a 3 mm ureteral stone.  Patient was treated with Toradol as well as IV narcotics.  Patient had some improvement in his pain.  Discussed the results with the patient.  Outpatient urology follow-up.  Will discharge home with prescriptions for pain medications. Final Clinical Impression(s) / ED Diagnoses Final diagnoses:  Left ureteral calculus    Rx / DC Orders ED Discharge Orders         Ordered    HYDROcodone-acetaminophen (NORCO/VICODIN) 5-325 MG tablet  Every 6 hours PRN        04/20/20 1146    naproxen (NAPROSYN) 375 MG tablet  2 times daily        04/20/20 1146    ondansetron (ZOFRAN ODT) 8 MG disintegrating tablet  Every 8 hours PRN        04/20/20 1149           Linwood Dibbles, MD 04/20/20 1149

## 2020-04-20 NOTE — Discharge Instructions (Signed)
Take the medications as needed for pain, follow up with the urologist for further evaluation, use a urine strainer to help determine if the stone passes

## 2020-04-20 NOTE — ED Notes (Signed)
Patient provided with urine strainer. Pt educated on how to use it. Pt verbalized understanding .

## 2020-04-20 NOTE — ED Triage Notes (Signed)
Pt reports that he has a kidney stone. Hx of same. Reports L flank pain. Denies vomiting.

## 2020-04-21 LAB — URINE CULTURE: Culture: NO GROWTH

## 2020-08-24 ENCOUNTER — Other Ambulatory Visit: Payer: Self-pay

## 2020-08-24 ENCOUNTER — Ambulatory Visit
Admission: RE | Admit: 2020-08-24 | Discharge: 2020-08-24 | Disposition: A | Discharge status: Home / Self Care | Payer: 59 | Source: Ambulatory Visit | Attending: Orthopedic Surgery | Admitting: Orthopedic Surgery

## 2020-08-24 ENCOUNTER — Other Ambulatory Visit: Payer: Self-pay | Admitting: Family Medicine

## 2020-08-24 ENCOUNTER — Other Ambulatory Visit: Payer: Self-pay | Admitting: Physician Assistant

## 2020-08-24 DIAGNOSIS — M25551 Pain in right hip: Secondary | ICD-10-CM

## 2021-03-15 IMAGING — CT CT RENAL STONE PROTOCOL
2 of 4 series · 16 of 46 positions shown, 18 images · non-contrast
Comparison: 03/26/2019 and earlier

CLINICAL DATA: Flank pain with kidney stone suspected

EXAM:
CT ABDOMEN AND PELVIS WITHOUT CONTRAST
TECHNIQUE: Multidetector CT imaging of the abdomen and pelvis was performed
following the standard protocol without IV contrast.

[Series 2: axial st · axial · 0.97mm/px · z∈[-484,-34]mm · 13 of 102 slices shown, 15 images]
[im 6/102  soft-tissue]
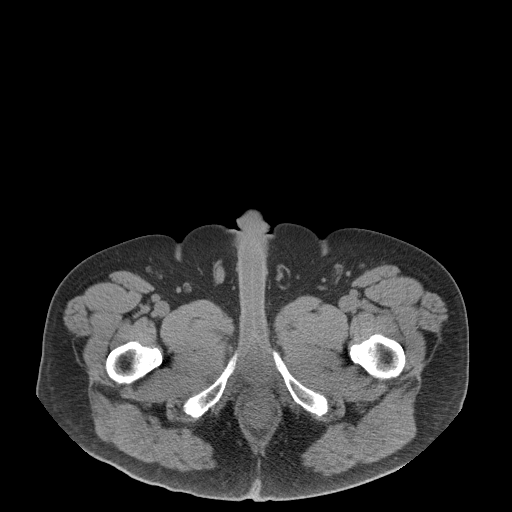
[im 6/102  bone]
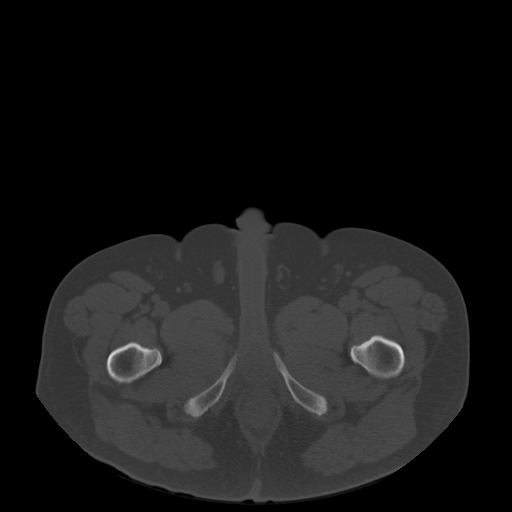
[im 12/102  soft-tissue]
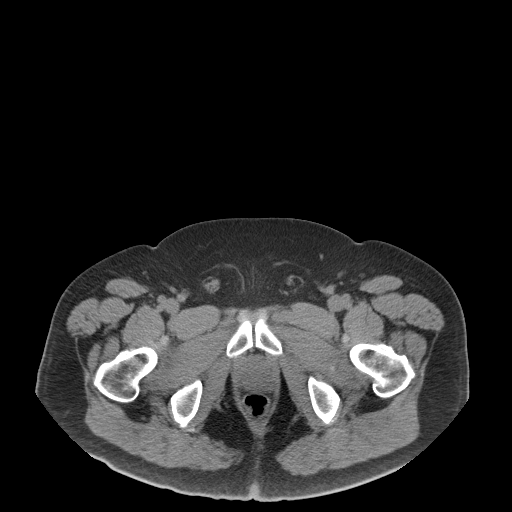
[im 23/102  soft-tissue]
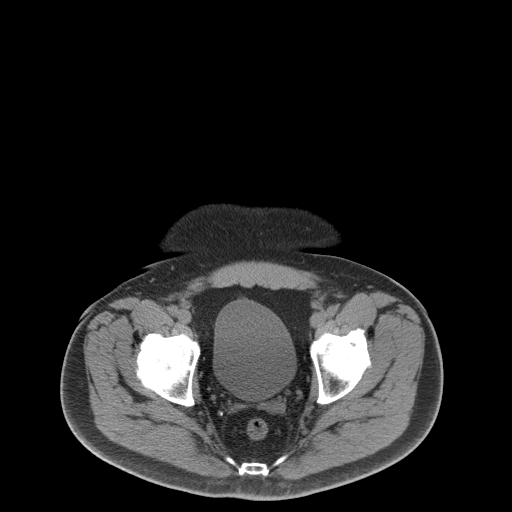
[im 29/102  soft-tissue]
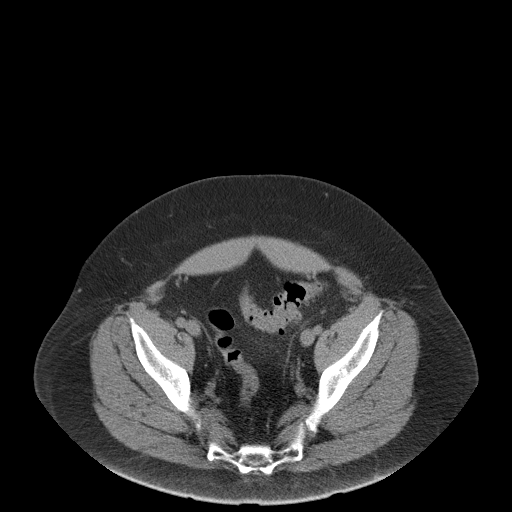
[im 34/102  soft-tissue]
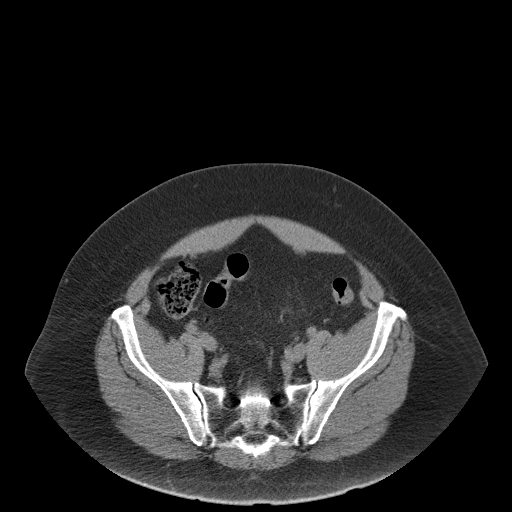
[im 45/102  soft-tissue]
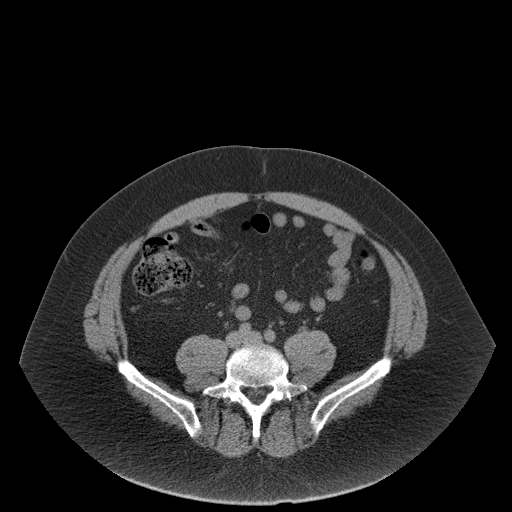
[im 51/102  soft-tissue]
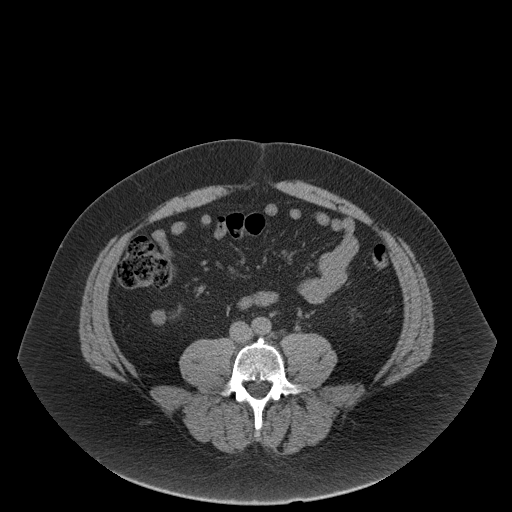
[im 57/102  soft-tissue]
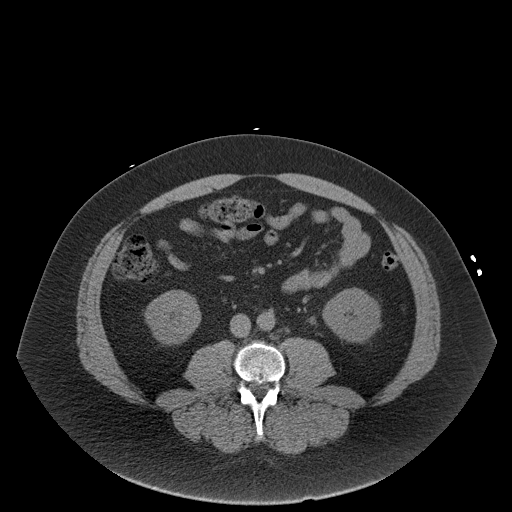
[im 68/102  soft-tissue]
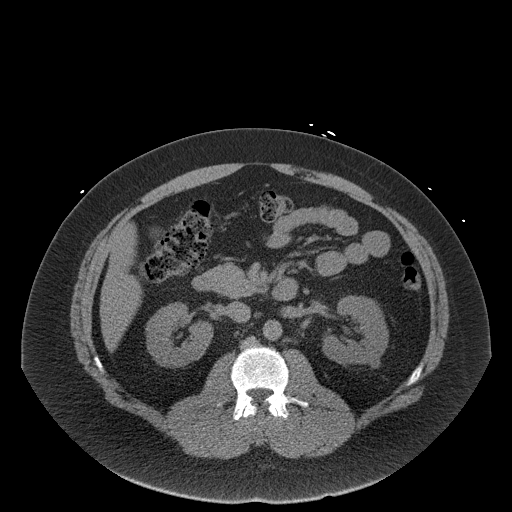
[im 68/102  bone]
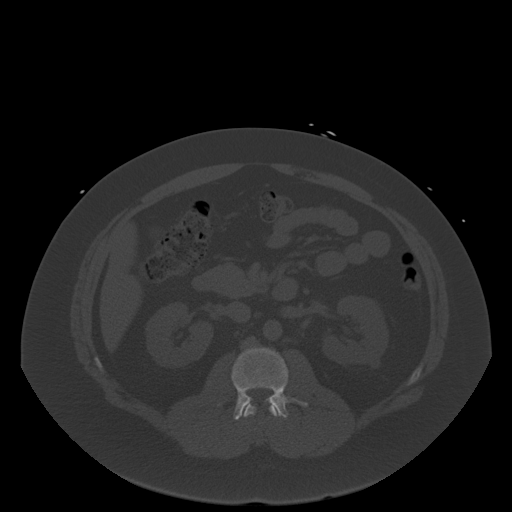
[im 73/102  soft-tissue]
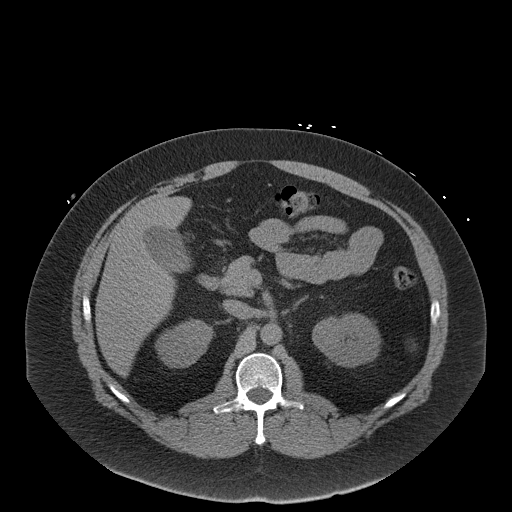
[im 79/102  soft-tissue]
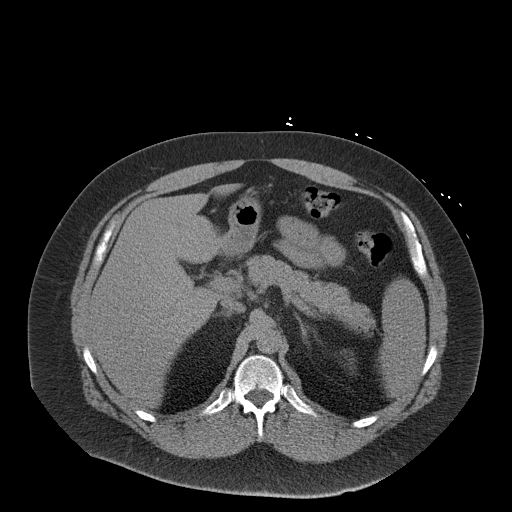
[im 90/102  soft-tissue]
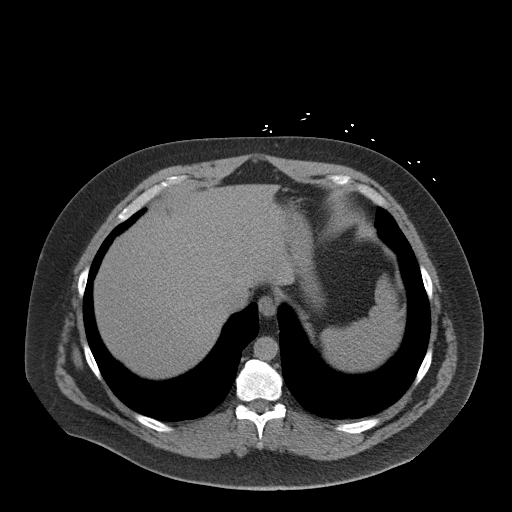
[im 96/102  soft-tissue]
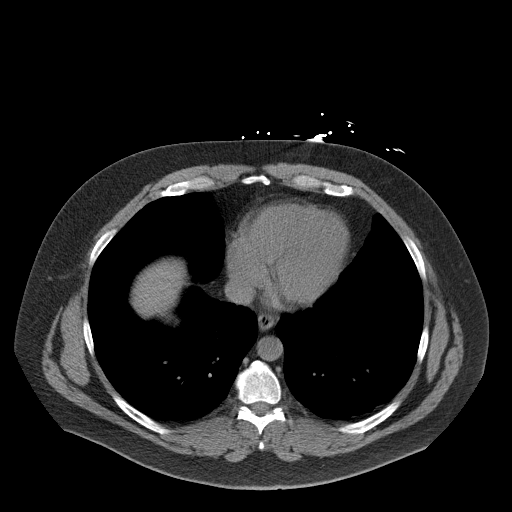

[Series 5: coronal · coronal · 1.00mm/px · 3 of 180 slices shown]
[im 60/180  soft-tissue]
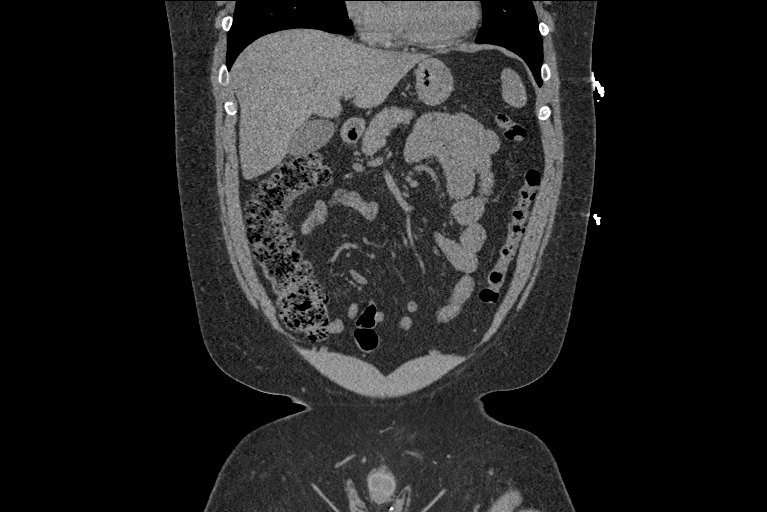
[im 80/180  soft-tissue]
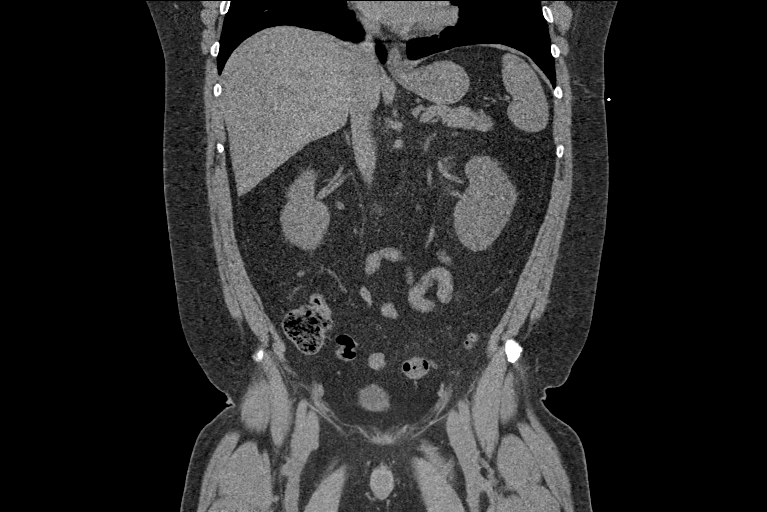
[im 100/180  soft-tissue]
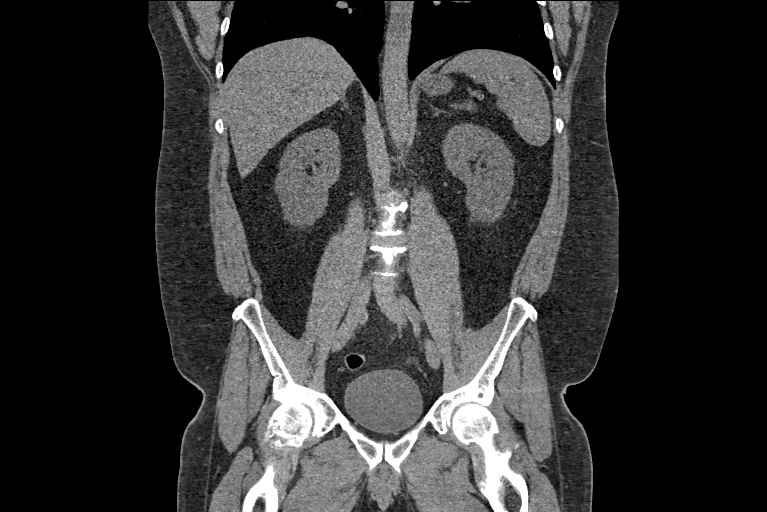

[16 of 46 positions shown; findings below may reference images not displayed]

FINDINGS: Lower chest:  No contributory findings.

Hepatobiliary: No focal liver abnormality.No evidence of biliary
obstruction or stone.

Pancreas: Unremarkable.

Spleen: Unremarkable.

Adrenals/Urinary Tract: Negative adrenals. Mild left hydronephrosis
from a 3 mm stone in the upper ureter, at the level of L3-4. There
is a punctate left lower pole calculus. Small low-density left renal
lesion which is much too small for densitometry, long-standing.
There is an exophytic right lower pole lesion measuring 17 mm which
is isodense on this study but convincingly cystic appearing on a
2924 enhanced CT, with effective size stability from that time.
Negative bladder.

Stomach/Bowel:  No obstruction. No appendicitis.

Vascular/Lymphatic: No acute vascular abnormality. No mass or
adenopathy.

Reproductive:No pathologic findings.

Other: No ascites or pneumoperitoneum.

Musculoskeletal: No acute abnormalities.
IMPRESSION: 3 mm left upper ureteral calculus with mild hydronephrosis.

## 2021-04-20 ENCOUNTER — Other Ambulatory Visit (HOSPITAL_COMMUNITY): Payer: Self-pay | Admitting: Orthopedic Surgery

## 2021-04-20 DIAGNOSIS — M79601 Pain in right arm: Secondary | ICD-10-CM

## 2021-04-21 ENCOUNTER — Ambulatory Visit (HOSPITAL_COMMUNITY)
Admission: RE | Admit: 2021-04-21 | Discharge: 2021-04-21 | Disposition: A | Discharge status: Home / Self Care | Payer: 59 | Source: Ambulatory Visit | Attending: Cardiology | Admitting: Cardiology

## 2021-04-21 ENCOUNTER — Other Ambulatory Visit: Payer: Self-pay

## 2021-04-21 DIAGNOSIS — M79601 Pain in right arm: Secondary | ICD-10-CM | POA: Diagnosis present
